# Patient Record
Sex: Male | Born: 1940 | Race: White | Hispanic: No | Marital: Married | Smoking: Never smoker
Health system: Southern US, Community
[De-identification: ages and names within clinical notes are randomized; demographics above are authoritative.]

## PROBLEM LIST (undated history)

## (undated) DIAGNOSIS — R069 Unspecified abnormalities of breathing: Secondary | ICD-10-CM

## (undated) DIAGNOSIS — I10 Essential (primary) hypertension: Secondary | ICD-10-CM

## (undated) DIAGNOSIS — G9340 Encephalopathy, unspecified: Secondary | ICD-10-CM

## (undated) DIAGNOSIS — N289 Disorder of kidney and ureter, unspecified: Secondary | ICD-10-CM

## (undated) HISTORY — PX: PET ALZHEIMER/DEMENTIA STUDY (ARMC HX): HXRAD1433

---

## 2015-01-05 DIAGNOSIS — G4733 Obstructive sleep apnea (adult) (pediatric): Secondary | ICD-10-CM | POA: Diagnosis not present

## 2015-01-07 DIAGNOSIS — H2511 Age-related nuclear cataract, right eye: Secondary | ICD-10-CM | POA: Diagnosis not present

## 2015-01-07 DIAGNOSIS — Z87891 Personal history of nicotine dependence: Secondary | ICD-10-CM | POA: Diagnosis not present

## 2015-01-07 DIAGNOSIS — H259 Unspecified age-related cataract: Secondary | ICD-10-CM | POA: Diagnosis not present

## 2015-01-07 DIAGNOSIS — Z79899 Other long term (current) drug therapy: Secondary | ICD-10-CM | POA: Diagnosis not present

## 2015-01-07 DIAGNOSIS — I10 Essential (primary) hypertension: Secondary | ICD-10-CM | POA: Diagnosis not present

## 2015-01-07 DIAGNOSIS — G4733 Obstructive sleep apnea (adult) (pediatric): Secondary | ICD-10-CM | POA: Diagnosis not present

## 2015-01-07 DIAGNOSIS — Z9981 Dependence on supplemental oxygen: Secondary | ICD-10-CM | POA: Diagnosis not present

## 2015-01-07 DIAGNOSIS — Z7982 Long term (current) use of aspirin: Secondary | ICD-10-CM | POA: Diagnosis not present

## 2015-01-07 DIAGNOSIS — H25811 Combined forms of age-related cataract, right eye: Secondary | ICD-10-CM | POA: Diagnosis not present

## 2015-02-05 DIAGNOSIS — G4733 Obstructive sleep apnea (adult) (pediatric): Secondary | ICD-10-CM | POA: Diagnosis not present

## 2015-02-07 DIAGNOSIS — G4733 Obstructive sleep apnea (adult) (pediatric): Secondary | ICD-10-CM | POA: Diagnosis not present

## 2015-02-18 DIAGNOSIS — Z79899 Other long term (current) drug therapy: Secondary | ICD-10-CM | POA: Diagnosis not present

## 2015-02-18 DIAGNOSIS — Z87891 Personal history of nicotine dependence: Secondary | ICD-10-CM | POA: Diagnosis not present

## 2015-02-18 DIAGNOSIS — H2512 Age-related nuclear cataract, left eye: Secondary | ICD-10-CM | POA: Diagnosis not present

## 2015-02-18 DIAGNOSIS — H25812 Combined forms of age-related cataract, left eye: Secondary | ICD-10-CM | POA: Diagnosis not present

## 2015-02-18 DIAGNOSIS — I10 Essential (primary) hypertension: Secondary | ICD-10-CM | POA: Diagnosis not present

## 2015-02-18 DIAGNOSIS — G4733 Obstructive sleep apnea (adult) (pediatric): Secondary | ICD-10-CM | POA: Diagnosis not present

## 2015-02-18 DIAGNOSIS — H259 Unspecified age-related cataract: Secondary | ICD-10-CM | POA: Diagnosis not present

## 2015-02-18 DIAGNOSIS — Z9981 Dependence on supplemental oxygen: Secondary | ICD-10-CM | POA: Diagnosis not present

## 2015-03-06 DIAGNOSIS — G4733 Obstructive sleep apnea (adult) (pediatric): Secondary | ICD-10-CM | POA: Diagnosis not present

## 2015-04-06 DIAGNOSIS — G4733 Obstructive sleep apnea (adult) (pediatric): Secondary | ICD-10-CM | POA: Diagnosis not present

## 2015-05-06 DIAGNOSIS — G4733 Obstructive sleep apnea (adult) (pediatric): Secondary | ICD-10-CM | POA: Diagnosis not present

## 2015-05-11 DIAGNOSIS — G4733 Obstructive sleep apnea (adult) (pediatric): Secondary | ICD-10-CM | POA: Diagnosis not present

## 2015-05-13 DIAGNOSIS — B356 Tinea cruris: Secondary | ICD-10-CM | POA: Diagnosis not present

## 2015-05-13 DIAGNOSIS — L300 Nummular dermatitis: Secondary | ICD-10-CM | POA: Diagnosis not present

## 2015-05-13 DIAGNOSIS — L570 Actinic keratosis: Secondary | ICD-10-CM | POA: Diagnosis not present

## 2015-06-06 DIAGNOSIS — G4733 Obstructive sleep apnea (adult) (pediatric): Secondary | ICD-10-CM | POA: Diagnosis not present

## 2015-07-06 DIAGNOSIS — G4733 Obstructive sleep apnea (adult) (pediatric): Secondary | ICD-10-CM | POA: Diagnosis not present

## 2015-07-30 DIAGNOSIS — I10 Essential (primary) hypertension: Secondary | ICD-10-CM | POA: Diagnosis not present

## 2015-07-30 DIAGNOSIS — Z0000 Encounter for general adult medical examination without abnormal findings: Secondary | ICD-10-CM | POA: Diagnosis not present

## 2015-07-30 DIAGNOSIS — E782 Mixed hyperlipidemia: Secondary | ICD-10-CM | POA: Diagnosis not present

## 2015-07-30 DIAGNOSIS — Z9181 History of falling: Secondary | ICD-10-CM | POA: Diagnosis not present

## 2015-08-06 DIAGNOSIS — G4733 Obstructive sleep apnea (adult) (pediatric): Secondary | ICD-10-CM | POA: Diagnosis not present

## 2015-09-06 DIAGNOSIS — G4733 Obstructive sleep apnea (adult) (pediatric): Secondary | ICD-10-CM | POA: Diagnosis not present

## 2015-09-20 DIAGNOSIS — I10 Essential (primary) hypertension: Secondary | ICD-10-CM | POA: Diagnosis not present

## 2015-09-20 DIAGNOSIS — Z7982 Long term (current) use of aspirin: Secondary | ICD-10-CM | POA: Diagnosis not present

## 2015-09-20 DIAGNOSIS — N132 Hydronephrosis with renal and ureteral calculous obstruction: Secondary | ICD-10-CM | POA: Diagnosis not present

## 2015-09-20 DIAGNOSIS — Z79899 Other long term (current) drug therapy: Secondary | ICD-10-CM | POA: Diagnosis not present

## 2015-09-20 DIAGNOSIS — N201 Calculus of ureter: Secondary | ICD-10-CM | POA: Diagnosis not present

## 2015-09-21 DIAGNOSIS — N132 Hydronephrosis with renal and ureteral calculous obstruction: Secondary | ICD-10-CM | POA: Diagnosis not present

## 2015-09-22 DIAGNOSIS — N200 Calculus of kidney: Secondary | ICD-10-CM | POA: Diagnosis not present

## 2015-09-25 DIAGNOSIS — H524 Presbyopia: Secondary | ICD-10-CM | POA: Diagnosis not present

## 2015-09-25 DIAGNOSIS — H26493 Other secondary cataract, bilateral: Secondary | ICD-10-CM | POA: Diagnosis not present

## 2015-10-03 DIAGNOSIS — G4733 Obstructive sleep apnea (adult) (pediatric): Secondary | ICD-10-CM | POA: Diagnosis not present

## 2015-10-06 DIAGNOSIS — G4733 Obstructive sleep apnea (adult) (pediatric): Secondary | ICD-10-CM | POA: Diagnosis not present

## 2015-11-06 DIAGNOSIS — G4733 Obstructive sleep apnea (adult) (pediatric): Secondary | ICD-10-CM | POA: Diagnosis not present

## 2015-11-10 DIAGNOSIS — I10 Essential (primary) hypertension: Secondary | ICD-10-CM | POA: Diagnosis not present

## 2015-11-10 DIAGNOSIS — K219 Gastro-esophageal reflux disease without esophagitis: Secondary | ICD-10-CM | POA: Diagnosis not present

## 2015-11-10 DIAGNOSIS — H1030 Unspecified acute conjunctivitis, unspecified eye: Secondary | ICD-10-CM | POA: Diagnosis not present

## 2015-11-10 DIAGNOSIS — J209 Acute bronchitis, unspecified: Secondary | ICD-10-CM | POA: Diagnosis not present

## 2015-11-13 DIAGNOSIS — G4733 Obstructive sleep apnea (adult) (pediatric): Secondary | ICD-10-CM | POA: Diagnosis not present

## 2015-11-18 DIAGNOSIS — E782 Mixed hyperlipidemia: Secondary | ICD-10-CM | POA: Diagnosis not present

## 2015-11-18 DIAGNOSIS — Z79899 Other long term (current) drug therapy: Secondary | ICD-10-CM | POA: Diagnosis not present

## 2015-11-25 DIAGNOSIS — Z0000 Encounter for general adult medical examination without abnormal findings: Secondary | ICD-10-CM | POA: Diagnosis not present

## 2015-11-27 DIAGNOSIS — R05 Cough: Secondary | ICD-10-CM | POA: Diagnosis not present

## 2015-12-06 DIAGNOSIS — G4733 Obstructive sleep apnea (adult) (pediatric): Secondary | ICD-10-CM | POA: Diagnosis not present

## 2016-01-06 DIAGNOSIS — G4733 Obstructive sleep apnea (adult) (pediatric): Secondary | ICD-10-CM | POA: Diagnosis not present

## 2016-02-06 DIAGNOSIS — G4733 Obstructive sleep apnea (adult) (pediatric): Secondary | ICD-10-CM | POA: Diagnosis not present

## 2016-02-12 DIAGNOSIS — G4733 Obstructive sleep apnea (adult) (pediatric): Secondary | ICD-10-CM | POA: Diagnosis not present

## 2016-03-05 DIAGNOSIS — G4733 Obstructive sleep apnea (adult) (pediatric): Secondary | ICD-10-CM | POA: Diagnosis not present

## 2016-03-18 DIAGNOSIS — Z1389 Encounter for screening for other disorder: Secondary | ICD-10-CM | POA: Diagnosis not present

## 2016-03-18 DIAGNOSIS — Z79899 Other long term (current) drug therapy: Secondary | ICD-10-CM | POA: Diagnosis not present

## 2016-03-18 DIAGNOSIS — E538 Deficiency of other specified B group vitamins: Secondary | ICD-10-CM | POA: Diagnosis not present

## 2016-03-18 DIAGNOSIS — I10 Essential (primary) hypertension: Secondary | ICD-10-CM | POA: Diagnosis not present

## 2016-03-18 DIAGNOSIS — Z2821 Immunization not carried out because of patient refusal: Secondary | ICD-10-CM | POA: Diagnosis not present

## 2016-03-18 DIAGNOSIS — R5383 Other fatigue: Secondary | ICD-10-CM | POA: Diagnosis not present

## 2016-03-22 DIAGNOSIS — E538 Deficiency of other specified B group vitamins: Secondary | ICD-10-CM | POA: Diagnosis not present

## 2016-03-29 DIAGNOSIS — D510 Vitamin B12 deficiency anemia due to intrinsic factor deficiency: Secondary | ICD-10-CM | POA: Diagnosis not present

## 2016-04-05 DIAGNOSIS — G4733 Obstructive sleep apnea (adult) (pediatric): Secondary | ICD-10-CM | POA: Diagnosis not present

## 2016-04-06 DIAGNOSIS — E538 Deficiency of other specified B group vitamins: Secondary | ICD-10-CM | POA: Diagnosis not present

## 2016-04-12 DIAGNOSIS — E538 Deficiency of other specified B group vitamins: Secondary | ICD-10-CM | POA: Diagnosis not present

## 2016-05-05 DIAGNOSIS — G4733 Obstructive sleep apnea (adult) (pediatric): Secondary | ICD-10-CM | POA: Diagnosis not present

## 2016-06-05 DIAGNOSIS — G4733 Obstructive sleep apnea (adult) (pediatric): Secondary | ICD-10-CM | POA: Diagnosis not present

## 2016-06-23 DIAGNOSIS — I4892 Unspecified atrial flutter: Secondary | ICD-10-CM | POA: Diagnosis present

## 2016-06-23 DIAGNOSIS — E785 Hyperlipidemia, unspecified: Secondary | ICD-10-CM | POA: Insufficient documentation

## 2016-06-23 DIAGNOSIS — I10 Essential (primary) hypertension: Secondary | ICD-10-CM | POA: Diagnosis present

## 2016-06-24 DIAGNOSIS — I4892 Unspecified atrial flutter: Secondary | ICD-10-CM | POA: Diagnosis not present

## 2016-06-24 DIAGNOSIS — E785 Hyperlipidemia, unspecified: Secondary | ICD-10-CM | POA: Diagnosis not present

## 2016-07-01 DIAGNOSIS — G4733 Obstructive sleep apnea (adult) (pediatric): Secondary | ICD-10-CM | POA: Diagnosis not present

## 2016-07-06 DIAGNOSIS — I4892 Unspecified atrial flutter: Secondary | ICD-10-CM | POA: Diagnosis not present

## 2016-08-04 DIAGNOSIS — D485 Neoplasm of uncertain behavior of skin: Secondary | ICD-10-CM | POA: Diagnosis not present

## 2016-08-04 DIAGNOSIS — L570 Actinic keratosis: Secondary | ICD-10-CM | POA: Diagnosis not present

## 2016-10-01 DIAGNOSIS — I10 Essential (primary) hypertension: Secondary | ICD-10-CM | POA: Diagnosis not present

## 2016-10-01 DIAGNOSIS — I82401 Acute embolism and thrombosis of unspecified deep veins of right lower extremity: Secondary | ICD-10-CM | POA: Diagnosis not present

## 2016-10-01 DIAGNOSIS — I82491 Acute embolism and thrombosis of other specified deep vein of right lower extremity: Secondary | ICD-10-CM | POA: Diagnosis not present

## 2016-10-01 DIAGNOSIS — Z79899 Other long term (current) drug therapy: Secondary | ICD-10-CM | POA: Diagnosis not present

## 2016-10-01 DIAGNOSIS — R600 Localized edema: Secondary | ICD-10-CM | POA: Diagnosis not present

## 2016-10-01 DIAGNOSIS — M79661 Pain in right lower leg: Secondary | ICD-10-CM | POA: Diagnosis not present

## 2016-10-04 DIAGNOSIS — I10 Essential (primary) hypertension: Secondary | ICD-10-CM | POA: Diagnosis not present

## 2016-10-04 DIAGNOSIS — I4892 Unspecified atrial flutter: Secondary | ICD-10-CM | POA: Diagnosis not present

## 2016-10-04 DIAGNOSIS — Z2821 Immunization not carried out because of patient refusal: Secondary | ICD-10-CM | POA: Diagnosis not present

## 2016-10-04 DIAGNOSIS — Z0000 Encounter for general adult medical examination without abnormal findings: Secondary | ICD-10-CM | POA: Diagnosis not present

## 2016-10-04 DIAGNOSIS — I82409 Acute embolism and thrombosis of unspecified deep veins of unspecified lower extremity: Secondary | ICD-10-CM | POA: Diagnosis not present

## 2016-11-19 DIAGNOSIS — R5381 Other malaise: Secondary | ICD-10-CM | POA: Diagnosis not present

## 2016-11-19 DIAGNOSIS — R5383 Other fatigue: Secondary | ICD-10-CM | POA: Diagnosis not present

## 2016-11-25 DIAGNOSIS — I10 Essential (primary) hypertension: Secondary | ICD-10-CM | POA: Diagnosis not present

## 2016-11-25 DIAGNOSIS — Z9181 History of falling: Secondary | ICD-10-CM | POA: Diagnosis not present

## 2016-11-25 DIAGNOSIS — I82409 Acute embolism and thrombosis of unspecified deep veins of unspecified lower extremity: Secondary | ICD-10-CM | POA: Diagnosis not present

## 2016-11-25 DIAGNOSIS — I4892 Unspecified atrial flutter: Secondary | ICD-10-CM | POA: Diagnosis not present

## 2016-12-10 DIAGNOSIS — E782 Mixed hyperlipidemia: Secondary | ICD-10-CM | POA: Diagnosis not present

## 2016-12-10 DIAGNOSIS — R432 Parageusia: Secondary | ICD-10-CM | POA: Diagnosis not present

## 2016-12-10 DIAGNOSIS — R7302 Impaired glucose tolerance (oral): Secondary | ICD-10-CM | POA: Diagnosis not present

## 2016-12-10 DIAGNOSIS — D510 Vitamin B12 deficiency anemia due to intrinsic factor deficiency: Secondary | ICD-10-CM | POA: Diagnosis not present

## 2016-12-14 DIAGNOSIS — D510 Vitamin B12 deficiency anemia due to intrinsic factor deficiency: Secondary | ICD-10-CM | POA: Diagnosis not present

## 2016-12-22 DIAGNOSIS — D510 Vitamin B12 deficiency anemia due to intrinsic factor deficiency: Secondary | ICD-10-CM | POA: Diagnosis not present

## 2016-12-31 DIAGNOSIS — D510 Vitamin B12 deficiency anemia due to intrinsic factor deficiency: Secondary | ICD-10-CM | POA: Diagnosis not present

## 2017-01-03 DIAGNOSIS — G4733 Obstructive sleep apnea (adult) (pediatric): Secondary | ICD-10-CM | POA: Diagnosis not present

## 2017-01-19 DIAGNOSIS — D510 Vitamin B12 deficiency anemia due to intrinsic factor deficiency: Secondary | ICD-10-CM | POA: Diagnosis not present

## 2017-01-25 DIAGNOSIS — I77819 Aortic ectasia, unspecified site: Secondary | ICD-10-CM | POA: Diagnosis not present

## 2017-01-25 DIAGNOSIS — K769 Liver disease, unspecified: Secondary | ICD-10-CM | POA: Diagnosis not present

## 2017-01-25 DIAGNOSIS — R918 Other nonspecific abnormal finding of lung field: Secondary | ICD-10-CM | POA: Diagnosis not present

## 2017-01-25 DIAGNOSIS — K869 Disease of pancreas, unspecified: Secondary | ICD-10-CM | POA: Diagnosis not present

## 2017-02-01 DIAGNOSIS — C259 Malignant neoplasm of pancreas, unspecified: Secondary | ICD-10-CM | POA: Diagnosis not present

## 2017-02-01 DIAGNOSIS — K862 Cyst of pancreas: Secondary | ICD-10-CM | POA: Diagnosis not present

## 2017-02-02 DIAGNOSIS — Z125 Encounter for screening for malignant neoplasm of prostate: Secondary | ICD-10-CM | POA: Diagnosis not present

## 2017-02-02 DIAGNOSIS — R946 Abnormal results of thyroid function studies: Secondary | ICD-10-CM | POA: Diagnosis not present

## 2017-02-02 DIAGNOSIS — R600 Localized edema: Secondary | ICD-10-CM | POA: Diagnosis not present

## 2017-02-02 DIAGNOSIS — K921 Melena: Secondary | ICD-10-CM | POA: Diagnosis not present

## 2017-02-04 DIAGNOSIS — R978 Other abnormal tumor markers: Secondary | ICD-10-CM | POA: Diagnosis not present

## 2017-02-04 DIAGNOSIS — F0390 Unspecified dementia without behavioral disturbance: Secondary | ICD-10-CM | POA: Diagnosis not present

## 2017-02-04 DIAGNOSIS — K769 Liver disease, unspecified: Secondary | ICD-10-CM | POA: Diagnosis not present

## 2017-02-04 DIAGNOSIS — K869 Disease of pancreas, unspecified: Secondary | ICD-10-CM | POA: Diagnosis not present

## 2017-02-07 DIAGNOSIS — R946 Abnormal results of thyroid function studies: Secondary | ICD-10-CM | POA: Diagnosis not present

## 2017-02-08 DIAGNOSIS — K5900 Constipation, unspecified: Secondary | ICD-10-CM | POA: Diagnosis not present

## 2017-02-08 DIAGNOSIS — K862 Cyst of pancreas: Secondary | ICD-10-CM | POA: Diagnosis not present

## 2017-02-08 DIAGNOSIS — K921 Melena: Secondary | ICD-10-CM | POA: Diagnosis not present

## 2017-02-14 DIAGNOSIS — R413 Other amnesia: Secondary | ICD-10-CM | POA: Diagnosis not present

## 2017-02-15 DIAGNOSIS — R413 Other amnesia: Secondary | ICD-10-CM | POA: Diagnosis not present

## 2017-02-15 DIAGNOSIS — I10 Essential (primary) hypertension: Secondary | ICD-10-CM | POA: Diagnosis not present

## 2017-02-15 DIAGNOSIS — Z9181 History of falling: Secondary | ICD-10-CM | POA: Diagnosis not present

## 2017-02-16 DIAGNOSIS — H5500 Unspecified nystagmus: Secondary | ICD-10-CM | POA: Diagnosis not present

## 2017-02-17 DIAGNOSIS — H53461 Homonymous bilateral field defects, right side: Secondary | ICD-10-CM | POA: Diagnosis not present

## 2017-02-18 DIAGNOSIS — D510 Vitamin B12 deficiency anemia due to intrinsic factor deficiency: Secondary | ICD-10-CM | POA: Diagnosis not present

## 2017-03-17 DIAGNOSIS — H4312 Vitreous hemorrhage, left eye: Secondary | ICD-10-CM | POA: Diagnosis not present

## 2017-03-23 DIAGNOSIS — H4312 Vitreous hemorrhage, left eye: Secondary | ICD-10-CM | POA: Diagnosis not present

## 2017-03-24 DIAGNOSIS — H4312 Vitreous hemorrhage, left eye: Secondary | ICD-10-CM | POA: Diagnosis not present

## 2017-03-30 DIAGNOSIS — D510 Vitamin B12 deficiency anemia due to intrinsic factor deficiency: Secondary | ICD-10-CM | POA: Diagnosis not present

## 2017-04-07 DIAGNOSIS — H43812 Vitreous degeneration, left eye: Secondary | ICD-10-CM | POA: Diagnosis not present

## 2017-04-21 DIAGNOSIS — H43812 Vitreous degeneration, left eye: Secondary | ICD-10-CM | POA: Diagnosis not present

## 2017-04-26 DIAGNOSIS — F0150 Vascular dementia without behavioral disturbance: Secondary | ICD-10-CM | POA: Diagnosis present

## 2017-04-26 DIAGNOSIS — Z9181 History of falling: Secondary | ICD-10-CM | POA: Diagnosis not present

## 2017-04-26 DIAGNOSIS — R413 Other amnesia: Secondary | ICD-10-CM | POA: Diagnosis not present

## 2017-04-28 DIAGNOSIS — H43812 Vitreous degeneration, left eye: Secondary | ICD-10-CM | POA: Diagnosis not present

## 2017-04-28 DIAGNOSIS — H353132 Nonexudative age-related macular degeneration, bilateral, intermediate dry stage: Secondary | ICD-10-CM | POA: Diagnosis not present

## 2017-05-03 DIAGNOSIS — R935 Abnormal findings on diagnostic imaging of other abdominal regions, including retroperitoneum: Secondary | ICD-10-CM | POA: Diagnosis not present

## 2017-05-03 DIAGNOSIS — I700 Atherosclerosis of aorta: Secondary | ICD-10-CM | POA: Diagnosis not present

## 2017-05-03 DIAGNOSIS — K869 Disease of pancreas, unspecified: Secondary | ICD-10-CM | POA: Diagnosis not present

## 2017-05-03 DIAGNOSIS — K8689 Other specified diseases of pancreas: Secondary | ICD-10-CM | POA: Diagnosis not present

## 2017-05-03 DIAGNOSIS — N400 Enlarged prostate without lower urinary tract symptoms: Secondary | ICD-10-CM | POA: Diagnosis not present

## 2017-05-04 DIAGNOSIS — L03115 Cellulitis of right lower limb: Secondary | ICD-10-CM | POA: Diagnosis not present

## 2017-05-04 DIAGNOSIS — K769 Liver disease, unspecified: Secondary | ICD-10-CM | POA: Diagnosis not present

## 2017-05-04 DIAGNOSIS — K869 Disease of pancreas, unspecified: Secondary | ICD-10-CM | POA: Diagnosis not present

## 2017-05-05 DIAGNOSIS — H353132 Nonexudative age-related macular degeneration, bilateral, intermediate dry stage: Secondary | ICD-10-CM | POA: Diagnosis not present

## 2017-05-05 DIAGNOSIS — H43813 Vitreous degeneration, bilateral: Secondary | ICD-10-CM | POA: Diagnosis not present

## 2017-05-30 DIAGNOSIS — R600 Localized edema: Secondary | ICD-10-CM | POA: Diagnosis not present

## 2017-05-30 DIAGNOSIS — D510 Vitamin B12 deficiency anemia due to intrinsic factor deficiency: Secondary | ICD-10-CM | POA: Diagnosis not present

## 2017-05-30 DIAGNOSIS — R5383 Other fatigue: Secondary | ICD-10-CM | POA: Diagnosis not present

## 2017-05-30 DIAGNOSIS — Z1389 Encounter for screening for other disorder: Secondary | ICD-10-CM | POA: Diagnosis not present

## 2017-05-30 DIAGNOSIS — L03119 Cellulitis of unspecified part of limb: Secondary | ICD-10-CM | POA: Diagnosis not present

## 2017-06-02 DIAGNOSIS — H353132 Nonexudative age-related macular degeneration, bilateral, intermediate dry stage: Secondary | ICD-10-CM | POA: Diagnosis not present

## 2017-06-07 ENCOUNTER — Encounter: Payer: Self-pay | Admitting: Neurology

## 2017-06-30 DIAGNOSIS — H353132 Nonexudative age-related macular degeneration, bilateral, intermediate dry stage: Secondary | ICD-10-CM | POA: Diagnosis not present

## 2017-06-30 DIAGNOSIS — H43813 Vitreous degeneration, bilateral: Secondary | ICD-10-CM | POA: Diagnosis not present

## 2017-07-22 DIAGNOSIS — M7021 Olecranon bursitis, right elbow: Secondary | ICD-10-CM | POA: Diagnosis not present

## 2017-07-26 DIAGNOSIS — D510 Vitamin B12 deficiency anemia due to intrinsic factor deficiency: Secondary | ICD-10-CM | POA: Diagnosis not present

## 2017-07-28 DIAGNOSIS — H43813 Vitreous degeneration, bilateral: Secondary | ICD-10-CM | POA: Diagnosis not present

## 2017-07-28 DIAGNOSIS — H353132 Nonexudative age-related macular degeneration, bilateral, intermediate dry stage: Secondary | ICD-10-CM | POA: Diagnosis not present

## 2017-08-08 DIAGNOSIS — L578 Other skin changes due to chronic exposure to nonionizing radiation: Secondary | ICD-10-CM | POA: Diagnosis not present

## 2017-08-08 DIAGNOSIS — D225 Melanocytic nevi of trunk: Secondary | ICD-10-CM | POA: Diagnosis not present

## 2017-08-08 DIAGNOSIS — L570 Actinic keratosis: Secondary | ICD-10-CM | POA: Diagnosis not present

## 2017-08-08 DIAGNOSIS — R233 Spontaneous ecchymoses: Secondary | ICD-10-CM | POA: Diagnosis not present

## 2017-09-12 ENCOUNTER — Encounter: Payer: Self-pay | Admitting: Neurology

## 2017-09-12 ENCOUNTER — Ambulatory Visit (INDEPENDENT_AMBULATORY_CARE_PROVIDER_SITE_OTHER): Payer: Medicare Other | Admitting: Neurology

## 2017-09-12 VITALS — BP 174/82 | HR 64 | Ht 70.0 in | Wt 235.4 lb

## 2017-09-12 DIAGNOSIS — I10 Essential (primary) hypertension: Secondary | ICD-10-CM

## 2017-09-12 DIAGNOSIS — G301 Alzheimer's disease with late onset: Secondary | ICD-10-CM

## 2017-09-12 DIAGNOSIS — F0280 Dementia in other diseases classified elsewhere without behavioral disturbance: Secondary | ICD-10-CM

## 2017-09-12 DIAGNOSIS — H5500 Unspecified nystagmus: Secondary | ICD-10-CM | POA: Diagnosis not present

## 2017-09-12 NOTE — Patient Instructions (Signed)
1.  Let me know if you wish to restart the dementia medications and I will prescribe it for you. 2.  Please bring the MRI images on CD for me to review 3.  Follow up in 6 months.   RESOURCES: Development worker, community of [facility]: [phone]  Tel [place]:  [phone]  [email or web address]/resources.[email or web address]   Resources for common questions found under "Pathways & Protocols "  www.senior-resources-[place].org/pathways/Pathways_Menu.htm   Resources for [facility] facilities:  www.ncnursinghomeguide.com   Alzheimer's Association: Website:  http://rojas.com/ Phone:  [phone]  For assistance with senior care, elder law, and estate planning (POA, medical directives):  [employer]  [address], [place] [address]  Tel: [phone]  [email or web address]   Berneice Heinrich  Tel: [phone]  [email or web address]

## 2017-09-12 NOTE — Progress Notes (Signed)
NEUROLOGY CONSULTATION NOTE  [name] MRN: [id number] DOB: [birth year]  Referring provider: Dr. Lin Bradley Primary care provider: Dr. Lin Bradley  Reason for consult:  Memory deficits, nystagmus  HISTORY OF PRESENT ILLNESS: Alexander Bradley is a 76 year old right-handed male with hypertension who presents for memory deficits and nystagmus.  He is accompanied by his son who supplements history.  Since summer 2017, he has had noticeable short term memory deficits.  Specifically, he would easily forget conversations after a day.  Subsequently, he started having trouble remembering to take his medications.  He also has had trouble keeping up with his bills and now his son has to assist with his finances.  His wife has Alzheimer's dementia, which has significantly progressed over the past year.  She now lives in a SNF and he currently is living by himself.  He is still driving, although only locally around his hometown of [place], and only to his son's house in [place].  He has not gotten disoriented on familiar routes.  He prepares his own meals and manages simple ADLs.  Other than medication and finances, he is independent with complex ADLs.  While he previously was stressed and had a little depressed mood caring for his wife, he is doing much better.  He has not had change in personality.  He has not exhibited combativeness or confusion.  He has not had hallucinations or delusions.    Also, he was evaluated by the eye doctor a few months ago and was noted to have horizontal nystagmus.  He denies double vision, slurred speech, trouble swallowing, unsteady gait/falls or unilateral numbness or weakness.  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  His mother had Alzheimer's dementia.  MRI of brain from 02/11/17 reportedly showed atrophy and chronic small vessel ischemic changes. 02/18/17:  Sed Rate 19, RPR negative. He has history of B12 deficiency and has  undergone injections. MMSE from 04/26/17 was 27/30.  Medications include:  amlodipine, lisinopril, Benicar, Xarelto (for DVT), ASA 81mg .  He was previously on Aricept and Namenda but was taken off of them.  PAST MEDICAL HISTORY: No past medical history on file.  PAST SURGICAL HISTORY: No past surgical history on file.  MEDICATIONS: No current outpatient prescriptions on file prior to visit.   No current facility-administered medications on file prior to visit.     ALLERGIES: Not on File  FAMILY HISTORY: No family history on file. .  SOCIAL HISTORY: Social History   Social History  . Marital status: Married    Spouse name: N/A  . Number of children: N/A  . Years of education: BS   Occupational History  . retired    Social History Main Topics  . Smoking status: Not on file  . Smokeless tobacco: Not on file  . Alcohol use Not on file  . Drug use: Unknown  . Sexual activity: Not on file   Other Topics Concern  . Not on file   Social History Narrative   Married for 61 yrs, wife is now in a SNF, he lives alone, mentioned he does not like it at all.   Has a one story home, no pets.   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, was in [employer]    REVIEW OF SYSTEMS: Constitutional: No fevers, chills, or sweats, no generalized fatigue, change in appetite Eyes: No visual changes, double vision, eye pain Ear, nose and throat: No  hearing loss, ear pain, nasal congestion, sore throat Cardiovascular: No chest pain, palpitations Respiratory:  No shortness of breath at rest or with exertion, wheezes GastrointestinaI: No nausea, vomiting, diarrhea, abdominal pain, fecal incontinence Genitourinary:  No dysuria, urinary retention or frequency Musculoskeletal:  No neck pain, back pain Integumentary: No rash, pruritus, skin lesions Neurological: as above Psychiatric: No depression, insomnia, anxiety Endocrine: No palpitations, fatigue,  diaphoresis, mood swings, change in appetite, change in weight, increased thirst Hematologic/Lymphatic:  No purpura, petechiae. Allergic/Immunologic: no itchy/runny eyes, nasal congestion, recent allergic reactions, rashes  PHYSICAL EXAM: BP: 174/82, pulse 64, SpO2 95%, Wt 235 lb 6.4 oz, Ht 5'10" General: No acute distress.  Patient appears well-groomed.  Head:  Normocephalic/atraumatic Eyes:  fundi examined but not visualized Neck: supple, no paraspinal tenderness, full range of motion Back: No paraspinal tenderness Heart: regular rate and rhythm Lungs: Clear to auscultation bilaterally. Vascular: No carotid bruits. Neurological Exam: Mental status: alert and oriented to person, place, and time, recent and remote memory intact, fund of knowledge intact, attention and concentration intact, speech fluent and not dysarthric, language intact. Montreal Cognitive Assessment  09/12/2017  Visuospatial/ Executive (0/5) 1  Naming (0/3) 3  Attention: Read list of digits (0/2) 1  Attention: Read list of letters (0/1) 0  Attention: Serial 7 subtraction starting at 100 (0/3) 3  Language: Repeat phrase (0/2) 2  Language : Fluency (0/1) 0  Abstraction (0/2) 2  Delayed Recall (0/5) 1  Orientation (0/6) 5  Total 18  Adjusted Score (based on education) 18   Cranial nerves: CN I: not tested CN II: pupils equal, round and reactive to light, visual fields intact CN III, IV, VI:  full range of motion, slight nonsustained horizontal nystagmus bilaterally, no ptosis CN V: facial sensation intact CN VII: upper and lower face symmetric CN VIII: hearing intact CN IX, X: gag intact, uvula midline CN XI: sternocleidomastoid and trapezius muscles intact CN XII: tongue midline Bulk & Tone: normal, no fasciculations. Motor:  5/5 throughout.   Sensation: temperature reduced and vibration sensation intact. Deep Tendon Reflexes:  2+ throughout but slightly brisk in the upper extremities, toes downgoing.    Finger to nose testing:  Without dysmetria. Heel to shin:  Without dysmetria.  Gait:  Normal station and stride with reduced arm swing, less on the left).  Able to turn, difficulty with tandem walk. Romberg negative.  IMPRESSION: 1.  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  Nystagmus.  It is not very  Impressive.  I don't suspect a serious underlying condition as other lateralizing symptoms from brainstem/cerebellum are absent and MRI reportedly unremarkable. 3.  Hypertension  PLAN: 1.  Advised to restart Aricept and Namenda.  They would like to first discuss with Dr. Lin Bradley. 2.  Limit driving to only around [place] and during daylight hours.  Periodically, his son should ride in the passenger seat to observe his driving. 3.  Use pillbox to help with taking medication.  Son should help him set it up in the beginning of the week. 4.  Consider neuropsychological testing 5.  Asked his son to bring me a copy of the MRI on CD for personal review. 6.  Advised to establish POA 7.  Regarding blood pressure, advised to contact Dr. Janace Aris office immediately. 8.  Follow up in 6 months.  Thank you for allowing me to take part in the care of this patient.  Alexander Clines, DO  CC:  Lovette Cliche II, MD

## 2017-09-13 ENCOUNTER — Encounter: Payer: Self-pay | Admitting: Neurology

## 2017-09-13 DIAGNOSIS — G309 Alzheimer's disease, unspecified: Secondary | ICD-10-CM | POA: Diagnosis not present

## 2017-09-13 DIAGNOSIS — Z23 Encounter for immunization: Secondary | ICD-10-CM | POA: Diagnosis not present

## 2017-09-13 DIAGNOSIS — I10 Essential (primary) hypertension: Secondary | ICD-10-CM | POA: Diagnosis not present

## 2017-09-26 DIAGNOSIS — D510 Vitamin B12 deficiency anemia due to intrinsic factor deficiency: Secondary | ICD-10-CM | POA: Diagnosis not present

## 2017-10-27 DIAGNOSIS — H26493 Other secondary cataract, bilateral: Secondary | ICD-10-CM | POA: Diagnosis not present

## 2017-10-27 DIAGNOSIS — H353132 Nonexudative age-related macular degeneration, bilateral, intermediate dry stage: Secondary | ICD-10-CM | POA: Diagnosis not present

## 2017-10-28 DIAGNOSIS — K869 Disease of pancreas, unspecified: Secondary | ICD-10-CM | POA: Diagnosis not present

## 2017-10-28 DIAGNOSIS — M9903 Segmental and somatic dysfunction of lumbar region: Secondary | ICD-10-CM | POA: Diagnosis not present

## 2017-10-28 DIAGNOSIS — S29019A Strain of muscle and tendon of unspecified wall of thorax, initial encounter: Secondary | ICD-10-CM | POA: Diagnosis not present

## 2017-10-28 DIAGNOSIS — M5387 Other specified dorsopathies, lumbosacral region: Secondary | ICD-10-CM | POA: Diagnosis not present

## 2017-10-28 DIAGNOSIS — R978 Other abnormal tumor markers: Secondary | ICD-10-CM | POA: Diagnosis not present

## 2017-10-28 DIAGNOSIS — S39012A Strain of muscle, fascia and tendon of lower back, initial encounter: Secondary | ICD-10-CM | POA: Diagnosis not present

## 2017-10-28 DIAGNOSIS — K8689 Other specified diseases of pancreas: Secondary | ICD-10-CM | POA: Diagnosis not present

## 2017-10-28 DIAGNOSIS — M9902 Segmental and somatic dysfunction of thoracic region: Secondary | ICD-10-CM | POA: Diagnosis not present

## 2017-11-02 DIAGNOSIS — M9902 Segmental and somatic dysfunction of thoracic region: Secondary | ICD-10-CM | POA: Diagnosis not present

## 2017-11-02 DIAGNOSIS — M5387 Other specified dorsopathies, lumbosacral region: Secondary | ICD-10-CM | POA: Diagnosis not present

## 2017-11-02 DIAGNOSIS — S39012A Strain of muscle, fascia and tendon of lower back, initial encounter: Secondary | ICD-10-CM | POA: Diagnosis not present

## 2017-11-02 DIAGNOSIS — M9903 Segmental and somatic dysfunction of lumbar region: Secondary | ICD-10-CM | POA: Diagnosis not present

## 2017-11-02 DIAGNOSIS — S29019A Strain of muscle and tendon of unspecified wall of thorax, initial encounter: Secondary | ICD-10-CM | POA: Diagnosis not present

## 2017-11-04 DIAGNOSIS — S29019A Strain of muscle and tendon of unspecified wall of thorax, initial encounter: Secondary | ICD-10-CM | POA: Diagnosis not present

## 2017-11-04 DIAGNOSIS — M9903 Segmental and somatic dysfunction of lumbar region: Secondary | ICD-10-CM | POA: Diagnosis not present

## 2017-11-04 DIAGNOSIS — M9902 Segmental and somatic dysfunction of thoracic region: Secondary | ICD-10-CM | POA: Diagnosis not present

## 2017-11-04 DIAGNOSIS — S39012A Strain of muscle, fascia and tendon of lower back, initial encounter: Secondary | ICD-10-CM | POA: Diagnosis not present

## 2017-11-04 DIAGNOSIS — M5387 Other specified dorsopathies, lumbosacral region: Secondary | ICD-10-CM | POA: Diagnosis not present

## 2017-11-09 DIAGNOSIS — M9903 Segmental and somatic dysfunction of lumbar region: Secondary | ICD-10-CM | POA: Diagnosis not present

## 2017-11-09 DIAGNOSIS — M9902 Segmental and somatic dysfunction of thoracic region: Secondary | ICD-10-CM | POA: Diagnosis not present

## 2017-11-09 DIAGNOSIS — S39012A Strain of muscle, fascia and tendon of lower back, initial encounter: Secondary | ICD-10-CM | POA: Diagnosis not present

## 2017-11-09 DIAGNOSIS — M5387 Other specified dorsopathies, lumbosacral region: Secondary | ICD-10-CM | POA: Diagnosis not present

## 2017-11-09 DIAGNOSIS — S29019A Strain of muscle and tendon of unspecified wall of thorax, initial encounter: Secondary | ICD-10-CM | POA: Diagnosis not present

## 2017-11-11 DIAGNOSIS — M9902 Segmental and somatic dysfunction of thoracic region: Secondary | ICD-10-CM | POA: Diagnosis not present

## 2017-11-11 DIAGNOSIS — S39012A Strain of muscle, fascia and tendon of lower back, initial encounter: Secondary | ICD-10-CM | POA: Diagnosis not present

## 2017-11-11 DIAGNOSIS — M5387 Other specified dorsopathies, lumbosacral region: Secondary | ICD-10-CM | POA: Diagnosis not present

## 2017-11-11 DIAGNOSIS — S29019A Strain of muscle and tendon of unspecified wall of thorax, initial encounter: Secondary | ICD-10-CM | POA: Diagnosis not present

## 2017-11-11 DIAGNOSIS — M9903 Segmental and somatic dysfunction of lumbar region: Secondary | ICD-10-CM | POA: Diagnosis not present

## 2017-11-14 DIAGNOSIS — S29019A Strain of muscle and tendon of unspecified wall of thorax, initial encounter: Secondary | ICD-10-CM | POA: Diagnosis not present

## 2017-11-14 DIAGNOSIS — M9903 Segmental and somatic dysfunction of lumbar region: Secondary | ICD-10-CM | POA: Diagnosis not present

## 2017-11-14 DIAGNOSIS — M9902 Segmental and somatic dysfunction of thoracic region: Secondary | ICD-10-CM | POA: Diagnosis not present

## 2017-11-14 DIAGNOSIS — M5387 Other specified dorsopathies, lumbosacral region: Secondary | ICD-10-CM | POA: Diagnosis not present

## 2017-11-14 DIAGNOSIS — S39012A Strain of muscle, fascia and tendon of lower back, initial encounter: Secondary | ICD-10-CM | POA: Diagnosis not present

## 2017-11-15 DIAGNOSIS — M545 Low back pain: Secondary | ICD-10-CM | POA: Diagnosis not present

## 2017-11-15 DIAGNOSIS — I10 Essential (primary) hypertension: Secondary | ICD-10-CM | POA: Diagnosis not present

## 2017-11-16 DIAGNOSIS — M9903 Segmental and somatic dysfunction of lumbar region: Secondary | ICD-10-CM | POA: Diagnosis not present

## 2017-11-16 DIAGNOSIS — S39012A Strain of muscle, fascia and tendon of lower back, initial encounter: Secondary | ICD-10-CM | POA: Diagnosis not present

## 2017-11-16 DIAGNOSIS — S29019A Strain of muscle and tendon of unspecified wall of thorax, initial encounter: Secondary | ICD-10-CM | POA: Diagnosis not present

## 2017-11-16 DIAGNOSIS — M9902 Segmental and somatic dysfunction of thoracic region: Secondary | ICD-10-CM | POA: Diagnosis not present

## 2017-11-16 DIAGNOSIS — M5387 Other specified dorsopathies, lumbosacral region: Secondary | ICD-10-CM | POA: Diagnosis not present

## 2017-11-21 DIAGNOSIS — M5387 Other specified dorsopathies, lumbosacral region: Secondary | ICD-10-CM | POA: Diagnosis not present

## 2017-11-21 DIAGNOSIS — S29019A Strain of muscle and tendon of unspecified wall of thorax, initial encounter: Secondary | ICD-10-CM | POA: Diagnosis not present

## 2017-11-21 DIAGNOSIS — M9903 Segmental and somatic dysfunction of lumbar region: Secondary | ICD-10-CM | POA: Diagnosis not present

## 2017-11-21 DIAGNOSIS — S39012A Strain of muscle, fascia and tendon of lower back, initial encounter: Secondary | ICD-10-CM | POA: Diagnosis not present

## 2017-11-21 DIAGNOSIS — M9902 Segmental and somatic dysfunction of thoracic region: Secondary | ICD-10-CM | POA: Diagnosis not present

## 2017-11-23 DIAGNOSIS — Z1339 Encounter for screening examination for other mental health and behavioral disorders: Secondary | ICD-10-CM | POA: Diagnosis not present

## 2017-11-23 DIAGNOSIS — M5387 Other specified dorsopathies, lumbosacral region: Secondary | ICD-10-CM | POA: Diagnosis not present

## 2017-11-23 DIAGNOSIS — M9903 Segmental and somatic dysfunction of lumbar region: Secondary | ICD-10-CM | POA: Diagnosis not present

## 2017-11-23 DIAGNOSIS — S39012A Strain of muscle, fascia and tendon of lower back, initial encounter: Secondary | ICD-10-CM | POA: Diagnosis not present

## 2017-11-23 DIAGNOSIS — S29019A Strain of muscle and tendon of unspecified wall of thorax, initial encounter: Secondary | ICD-10-CM | POA: Diagnosis not present

## 2017-11-23 DIAGNOSIS — Z0000 Encounter for general adult medical examination without abnormal findings: Secondary | ICD-10-CM | POA: Diagnosis not present

## 2017-11-23 DIAGNOSIS — M9902 Segmental and somatic dysfunction of thoracic region: Secondary | ICD-10-CM | POA: Diagnosis not present

## 2017-11-24 ENCOUNTER — Other Ambulatory Visit: Payer: Self-pay

## 2017-11-24 NOTE — Patient Outreach (Signed)
[facility]) Care Management  11/24/2017  Alexander Bradley 03/21/1941 [id number]   Medication Adherence call to Alexander Bradley patient is showing past due under [facility] Ins.on Olmesartan 20 mg spoke with patient he said doctor Lovette Cliche took him off this medication he is no longer taking this medication patient said he is now taking Amlodipine 10 mg.   [place] Management Direct Dial [phone]  Fax [phone] [email or web address]

## 2017-11-25 DIAGNOSIS — M9903 Segmental and somatic dysfunction of lumbar region: Secondary | ICD-10-CM | POA: Diagnosis not present

## 2017-11-25 DIAGNOSIS — M9902 Segmental and somatic dysfunction of thoracic region: Secondary | ICD-10-CM | POA: Diagnosis not present

## 2017-11-25 DIAGNOSIS — S29019A Strain of muscle and tendon of unspecified wall of thorax, initial encounter: Secondary | ICD-10-CM | POA: Diagnosis not present

## 2017-11-25 DIAGNOSIS — S39012A Strain of muscle, fascia and tendon of lower back, initial encounter: Secondary | ICD-10-CM | POA: Diagnosis not present

## 2017-11-25 DIAGNOSIS — M5387 Other specified dorsopathies, lumbosacral region: Secondary | ICD-10-CM | POA: Diagnosis not present

## 2017-11-28 DIAGNOSIS — M9903 Segmental and somatic dysfunction of lumbar region: Secondary | ICD-10-CM | POA: Diagnosis not present

## 2017-11-28 DIAGNOSIS — S39012A Strain of muscle, fascia and tendon of lower back, initial encounter: Secondary | ICD-10-CM | POA: Diagnosis not present

## 2017-11-28 DIAGNOSIS — M5387 Other specified dorsopathies, lumbosacral region: Secondary | ICD-10-CM | POA: Diagnosis not present

## 2017-11-28 DIAGNOSIS — S29019A Strain of muscle and tendon of unspecified wall of thorax, initial encounter: Secondary | ICD-10-CM | POA: Diagnosis not present

## 2017-11-28 DIAGNOSIS — M9902 Segmental and somatic dysfunction of thoracic region: Secondary | ICD-10-CM | POA: Diagnosis not present

## 2017-12-01 DIAGNOSIS — M545 Low back pain: Secondary | ICD-10-CM | POA: Diagnosis not present

## 2017-12-01 DIAGNOSIS — R2689 Other abnormalities of gait and mobility: Secondary | ICD-10-CM | POA: Diagnosis not present

## 2017-12-07 DIAGNOSIS — M545 Low back pain: Secondary | ICD-10-CM | POA: Diagnosis not present

## 2017-12-07 DIAGNOSIS — R2689 Other abnormalities of gait and mobility: Secondary | ICD-10-CM | POA: Diagnosis not present

## 2017-12-09 DIAGNOSIS — R2689 Other abnormalities of gait and mobility: Secondary | ICD-10-CM | POA: Diagnosis not present

## 2017-12-09 DIAGNOSIS — M545 Low back pain: Secondary | ICD-10-CM | POA: Diagnosis not present

## 2017-12-13 DIAGNOSIS — M545 Low back pain: Secondary | ICD-10-CM | POA: Diagnosis not present

## 2017-12-13 DIAGNOSIS — R2689 Other abnormalities of gait and mobility: Secondary | ICD-10-CM | POA: Diagnosis not present

## 2017-12-15 DIAGNOSIS — R2689 Other abnormalities of gait and mobility: Secondary | ICD-10-CM | POA: Diagnosis not present

## 2017-12-15 DIAGNOSIS — M545 Low back pain: Secondary | ICD-10-CM | POA: Diagnosis not present

## 2017-12-21 DIAGNOSIS — R2689 Other abnormalities of gait and mobility: Secondary | ICD-10-CM | POA: Diagnosis not present

## 2017-12-21 DIAGNOSIS — M545 Low back pain: Secondary | ICD-10-CM | POA: Diagnosis not present

## 2018-02-01 DIAGNOSIS — R2689 Other abnormalities of gait and mobility: Secondary | ICD-10-CM | POA: Diagnosis not present

## 2018-02-01 DIAGNOSIS — M545 Low back pain: Secondary | ICD-10-CM | POA: Diagnosis not present

## 2018-02-08 DIAGNOSIS — R2689 Other abnormalities of gait and mobility: Secondary | ICD-10-CM | POA: Diagnosis not present

## 2018-02-08 DIAGNOSIS — M545 Low back pain: Secondary | ICD-10-CM | POA: Diagnosis not present

## 2018-03-13 ENCOUNTER — Ambulatory Visit: Payer: Medicare Other | Admitting: Neurology

## 2018-04-08 DIAGNOSIS — M7020 Olecranon bursitis, unspecified elbow: Secondary | ICD-10-CM | POA: Diagnosis not present

## 2018-04-11 DIAGNOSIS — R600 Localized edema: Secondary | ICD-10-CM | POA: Diagnosis not present

## 2018-04-11 DIAGNOSIS — M25421 Effusion, right elbow: Secondary | ICD-10-CM | POA: Diagnosis not present

## 2018-04-26 DIAGNOSIS — H26493 Other secondary cataract, bilateral: Secondary | ICD-10-CM | POA: Diagnosis not present

## 2018-04-26 DIAGNOSIS — H353131 Nonexudative age-related macular degeneration, bilateral, early dry stage: Secondary | ICD-10-CM | POA: Diagnosis not present

## 2018-05-01 DIAGNOSIS — I700 Atherosclerosis of aorta: Secondary | ICD-10-CM | POA: Diagnosis not present

## 2018-05-01 DIAGNOSIS — K5730 Diverticulosis of large intestine without perforation or abscess without bleeding: Secondary | ICD-10-CM | POA: Diagnosis not present

## 2018-05-01 DIAGNOSIS — K862 Cyst of pancreas: Secondary | ICD-10-CM | POA: Diagnosis not present

## 2018-05-01 DIAGNOSIS — I77811 Abdominal aortic ectasia: Secondary | ICD-10-CM | POA: Diagnosis not present

## 2018-05-01 DIAGNOSIS — K869 Disease of pancreas, unspecified: Secondary | ICD-10-CM | POA: Diagnosis not present

## 2018-05-04 DIAGNOSIS — I10 Essential (primary) hypertension: Secondary | ICD-10-CM | POA: Diagnosis not present

## 2018-05-04 DIAGNOSIS — K869 Disease of pancreas, unspecified: Secondary | ICD-10-CM | POA: Diagnosis not present

## 2018-06-05 ENCOUNTER — Ambulatory Visit: Payer: Medicare Other | Admitting: Neurology

## 2018-06-08 DIAGNOSIS — M25561 Pain in right knee: Secondary | ICD-10-CM | POA: Diagnosis not present

## 2018-06-08 DIAGNOSIS — I10 Essential (primary) hypertension: Secondary | ICD-10-CM | POA: Diagnosis not present

## 2018-06-08 DIAGNOSIS — R600 Localized edema: Secondary | ICD-10-CM | POA: Diagnosis not present

## 2018-06-09 DIAGNOSIS — R600 Localized edema: Secondary | ICD-10-CM | POA: Diagnosis not present

## 2018-06-19 DIAGNOSIS — I10 Essential (primary) hypertension: Secondary | ICD-10-CM | POA: Diagnosis not present

## 2018-06-19 DIAGNOSIS — R600 Localized edema: Secondary | ICD-10-CM | POA: Diagnosis not present

## 2018-08-07 ENCOUNTER — Other Ambulatory Visit: Payer: Self-pay

## 2018-08-07 NOTE — Patient Outreach (Signed)
[facility]) Care Management  08/07/2018  Courtenay Creger [birth year] [id number]   Medication Adherence call to Mr. Alexander Bradley left a message for patient to call back patient is due on Olmesartan 20 mg. Mr. Alexander Bradley is showing past due under [place].   [place] Management Direct Dial [phone]  Fax [phone] [email or web address]

## 2018-08-09 ENCOUNTER — Other Ambulatory Visit: Payer: Self-pay

## 2018-08-09 DIAGNOSIS — I8310 Varicose veins of unspecified lower extremity with inflammation: Secondary | ICD-10-CM | POA: Diagnosis not present

## 2018-08-09 DIAGNOSIS — L821 Other seborrheic keratosis: Secondary | ICD-10-CM | POA: Diagnosis not present

## 2018-08-09 DIAGNOSIS — L578 Other skin changes due to chronic exposure to nonionizing radiation: Secondary | ICD-10-CM | POA: Diagnosis not present

## 2018-08-09 DIAGNOSIS — R233 Spontaneous ecchymoses: Secondary | ICD-10-CM | POA: Diagnosis not present

## 2018-08-09 DIAGNOSIS — L570 Actinic keratosis: Secondary | ICD-10-CM | POA: Diagnosis not present

## 2018-08-09 NOTE — Patient Outreach (Signed)
[facility]) Care Management  08/09/2018  Kelden Lavallee 12-04-1941 [id number]   Mr. Kittel call back he wants a refill on his Olmesartan 20 mg he ask if we can call [facility] and Order it for him, call [facility]  they will have it ready for patient he has one more refill after this.Mr. Boyadjian is showing past due under [facility].  [facility] Management Direct Dial [phone]  Fax [phone] [email or web address]

## 2018-08-11 DIAGNOSIS — R2689 Other abnormalities of gait and mobility: Secondary | ICD-10-CM | POA: Diagnosis not present

## 2018-08-11 DIAGNOSIS — R2681 Unsteadiness on feet: Secondary | ICD-10-CM | POA: Diagnosis not present

## 2018-08-11 DIAGNOSIS — M6281 Muscle weakness (generalized): Secondary | ICD-10-CM | POA: Diagnosis not present

## 2018-08-17 DIAGNOSIS — M6281 Muscle weakness (generalized): Secondary | ICD-10-CM | POA: Diagnosis not present

## 2018-08-17 DIAGNOSIS — R2681 Unsteadiness on feet: Secondary | ICD-10-CM | POA: Diagnosis not present

## 2018-08-17 DIAGNOSIS — R2689 Other abnormalities of gait and mobility: Secondary | ICD-10-CM | POA: Diagnosis not present

## 2018-10-03 NOTE — Progress Notes (Addendum)
NEUROLOGY FOLLOW UP OFFICE NOTE  Alexander Bradley [id number]  HISTORY OF PRESENT ILLNESS: Alexander Bradley is a 77 year old right-handed male with hypertension who follows up for Alzheimer's disease.  He is accompanied by his son who supplements history.  UPDATE: A year ago today, his wife passed away from Alzheimer's disease.   He now lives in an independent living apartment facility in [place].  He was moved out of his home due to a mold issue.  He does have care available if needed.  His son checks up on his medications.  Overall, he is able to perform ADLs.  He still drives only in a couple of miles radius from home in [place].     HISTORY: Since summer 2017, he has had noticeable short term memory deficits.  Specifically, he would easily forget conversations after a day.  Subsequently, he started having trouble remembering to take his medications.  He also has had trouble keeping up with his bills and now his son has to assist with his finances.  His wife has Alzheimer's dementia, which has significantly progressed over the past year.  She now lives in a SNF and he currently is living by himself.  He is still driving, although only locally around his hometown of [place], and only to his son's house in [place].  He has not gotten disoriented on familiar routes.  He prepares his own meals and manages simple ADLs.  Other than medication and finances, he is independent with complex ADLs.  While he previously was stressed and had a little depressed mood caring for his wife, he is doing much better.  He has not had change in personality.  He has not exhibited combativeness or confusion.  He has not had hallucinations or delusions.     Also, he was evaluated by the eye doctor a few months ago and was noted to have horizontal nystagmus.  He denies double vision, slurred speech, trouble swallowing, unsteady gait/falls or unilateral numbness or weakness.   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  His mother had Alzheimer's dementia.   MRI of brain from 02/11/17 reportedly showed atrophy and chronic small vessel ischemic changes. 02/18/17:  Sed Rate 19, RPR negative. He has history of B12 deficiency and has undergone injections. MMSE from 04/26/17 was 27/30.   Medications include:  amlodipine, lisinopril, Benicar, Xarelto (for DVT), ASA 81mg .   He was previously on Aricept and Namenda but was taken off of them.  PAST MEDICAL HISTORY: No past medical history on file.  MEDICATIONS: Current Outpatient Medications on File Prior to Visit  Medication Sig Dispense Refill  . amLODipine (NORVASC) 5 MG tablet Take 5 mg by mouth daily.    Marland Kitchen aspirin EC 81 MG tablet Take 81 mg by mouth daily.    . memantine (NAMENDA) 5 MG tablet Take 5 mg by mouth 4 (four) times daily.    Marland Kitchen olmesartan (BENICAR) 20 MG tablet Take 20 mg by mouth daily.    . rivaroxaban (XARELTO) 20 MG TABS tablet Take 20 mg by mouth as directed.     No current facility-administered medications on file prior to visit.     ALLERGIES: Not on File  FAMILY HISTORY: Alzheimer's disease:  Mother  SOCIAL HISTORY: Social History   Socioeconomic History  . Marital status: Married    Spouse name: Not on file  . Number of children: Not on file  . Years of education: BS  .  Highest education level: Not on file  Occupational History  . Occupation: retired  Scientific laboratory technician  . Financial resource strain: Not on file  . Food insecurity:    Worry: Not on file    Inability: Not on file  . Transportation needs:    Medical: Not on file    Non-medical: Not on file  Tobacco Use  . Smoking status: Never Smoker  . Smokeless tobacco: Never Used  Substance and Sexual Activity  . Alcohol use: No  . Drug use: No  . Sexual activity: Not on file  Lifestyle  . Physical activity:    Days per week: Not on file    Minutes per session: Not on file  . Stress: Not on file  Relationships  . Social  connections:    Talks on phone: Not on file    Gets together: Not on file    Attends religious service: Not on file    Active member of club or organization: Not on file    Attends meetings of clubs or organizations: Not on file    Relationship status: Not on file  . Intimate partner violence:    Fear of current or ex partner: Not on file    Emotionally abused: Not on file    Physically abused: Not on file    Forced sexual activity: Not on file  Other Topics Concern  . Not on file  Social History Narrative   Married for 31 yrs, wife is now in a SNF, he lives alone, mentioned he does not like it at all.   Has a one story home, no pets.   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, was in [employer]    [place]: Constitutional: No fevers, chills, or sweats, no generalized fatigue, change in appetite Eyes: No visual changes, double vision, eye pain Ear, nose and throat: No hearing loss, ear pain, nasal congestion, sore throat Cardiovascular: No chest pain, palpitations Respiratory:  No shortness of breath at rest or with exertion, wheezes GastrointestinaI: No nausea, vomiting, diarrhea, abdominal pain, fecal incontinence Genitourinary:  No dysuria, urinary retention or frequency Musculoskeletal:  No neck pain, back pain Integumentary: No rash, pruritus, skin lesions Neurological: as above Psychiatric: No depression, insomnia, anxiety Endocrine: No palpitations, fatigue, diaphoresis, mood swings, change in appetite, change in weight, increased thirst Hematologic/Lymphatic:  No purpura, petechiae. Allergic/Immunologic: no itchy/runny eyes, nasal congestion, recent allergic reactions, rashes  PHYSICAL EXAM: Blood pressure (!) 146/86, pulse 66, height 5\' 10"  (1.778 m), weight 233 lb (105.7 kg), SpO2 93 %. General: No acute distress.  Patient appears well-groomed.   Head:  Normocephalic/atraumatic Eyes:  Fundi examined but not visualized Neck:  supple, no paraspinal tenderness, full range of motion Heart:  Regular rate and rhythm Lungs:  Clear to auscultation bilaterally Back: No paraspinal tenderness Neurological Exam: alert and oriented to person, place, and time. Attention span and concentration intact, recent memory reduced, remote memory intact, fund of knowledge intact.  Speech fluent and not dysarthric, language intact.  Unable to correctly complete the Trail Making Test, copy a cube or draw a clock. MMSE - Mini Mental State Exam 10/04/2018  Orientation to time 5  Orientation to Place 4  Registration 3  Attention/ Calculation 3  Recall 3  Language- name 2 objects 2  Language- repeat 1  Language- follow 3 step command 3  Language- read & follow direction 1  Write a sentence 1  Copy design 0  Total score 26   CN II-XII intact. Bulk and tone normal, muscle strength 5/5 throughout.  Sensation to light touch ntact.  Deep tendon reflexes trace throughout.  Finger to nose and heel to shin testing intact.  Gait steady, Romberg negative.  IMPRESSION: Alzheimer's dementia  PLAN: 1.  We will start Namenda:  5mg  at bedtime for 1 week, then 5mg  twice daily for 1 week, then 10mg  twice daily 2.  Advised to discontinue driving as he exhibits significant impairment in visuospatial and executive function. 3.  Resources provided. 4.  Follow up in 6 months.  25 minutes spent face to face with patient, over 50% spent discussing diagnosis and management.  Metta Clines, DO  CC: Angelina Sheriff, MD

## 2018-10-04 ENCOUNTER — Encounter: Payer: Self-pay | Admitting: Neurology

## 2018-10-04 ENCOUNTER — Ambulatory Visit: Payer: Medicare Other | Admitting: Neurology

## 2018-10-04 VITALS — BP 146/86 | HR 66 | Ht 70.0 in | Wt 233.0 lb

## 2018-10-04 DIAGNOSIS — F0280 Dementia in other diseases classified elsewhere without behavioral disturbance: Secondary | ICD-10-CM

## 2018-10-04 DIAGNOSIS — G301 Alzheimer's disease with late onset: Secondary | ICD-10-CM

## 2018-10-04 MED ORDER — MEMANTINE HCL 10 MG PO TABS: ORAL_TABLET | ORAL | 0 refills | Status: DC

## 2018-10-04 NOTE — Patient Instructions (Signed)
1.  Start Namenda (memantine) 10mg  tablets.  Take 1/2 tablet at bedtime for 7 days, then 1/2 tablet twice daily for 7 days, then 1 tablet twice daily.   Side effects include dizziness, headache, diarrhea or constipation.  Call with any questions or concerns. 2.  It is my assessment that you should discontinue driving. 3.  Follow up in 6 months.

## 2019-01-30 DIAGNOSIS — H353132 Nonexudative age-related macular degeneration, bilateral, intermediate dry stage: Secondary | ICD-10-CM | POA: Diagnosis not present

## 2019-02-22 DIAGNOSIS — R7302 Impaired glucose tolerance (oral): Secondary | ICD-10-CM | POA: Diagnosis not present

## 2019-02-22 DIAGNOSIS — Z0000 Encounter for general adult medical examination without abnormal findings: Secondary | ICD-10-CM | POA: Diagnosis not present

## 2019-02-22 DIAGNOSIS — R600 Localized edema: Secondary | ICD-10-CM | POA: Diagnosis not present

## 2019-02-22 DIAGNOSIS — E782 Mixed hyperlipidemia: Secondary | ICD-10-CM | POA: Diagnosis not present

## 2019-02-22 DIAGNOSIS — I10 Essential (primary) hypertension: Secondary | ICD-10-CM | POA: Diagnosis not present

## 2019-02-22 DIAGNOSIS — Z79899 Other long term (current) drug therapy: Secondary | ICD-10-CM | POA: Diagnosis not present

## 2019-02-27 DIAGNOSIS — R600 Localized edema: Secondary | ICD-10-CM | POA: Diagnosis not present

## 2019-04-05 ENCOUNTER — Ambulatory Visit: Payer: Medicare Other | Admitting: Neurology

## 2019-07-20 DIAGNOSIS — D519 Vitamin B12 deficiency anemia, unspecified: Secondary | ICD-10-CM | POA: Diagnosis not present

## 2019-07-20 DIAGNOSIS — Z0000 Encounter for general adult medical examination without abnormal findings: Secondary | ICD-10-CM | POA: Diagnosis not present

## 2019-07-20 DIAGNOSIS — Z23 Encounter for immunization: Secondary | ICD-10-CM | POA: Diagnosis not present

## 2019-07-20 DIAGNOSIS — Z79899 Other long term (current) drug therapy: Secondary | ICD-10-CM | POA: Diagnosis not present

## 2019-07-20 DIAGNOSIS — K869 Disease of pancreas, unspecified: Secondary | ICD-10-CM | POA: Diagnosis not present

## 2019-07-23 NOTE — Progress Notes (Deleted)
NEUROLOGY FOLLOW UP OFFICE NOTE  [name] [id number]  HISTORY OF PRESENT ILLNESS: Alexander Bradley is an 78 year old right-handed male with hypertension who follows up for Alzheimer's disease.  He is accompanied by his son who supplements history.  UPDATE: He takes memantine 10mg  twice daily.  He lives in an independent living apartment facility in [place].  He was moved out of his home due to a mold issue.  He does have care available if needed.  His son checks up on his medications.  Overall, he is able to perform ADLs.  He still drives only in a couple of miles radius from home in [place].    HISTORY: Since summer 2017, he has had noticeable short term memory deficits.  Specifically, he would easily forget conversations after a day.  Subsequently, he started having trouble remembering to take his medications.  He also has had trouble keeping up with his bills and now his son has to assist with his finances.  His wife has Alzheimer's dementia, which has significantly progressed over the past year.  She now lives in a SNF and he currently is living by himself.  He is still driving, although only locally around his hometown of [place], and only to his son's house in [place].  He has not gotten disoriented on familiar routes.  He prepares his own meals and manages simple ADLs.  Other than medication and finances, he is independent with complex ADLs.  While he previously was stressed and had a little depressed mood caring for his wife, he is doing much better.  He has not had change in personality.  He has not exhibited combativeness or confusion.  He has not had hallucinations or delusions.    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  His mother had Alzheimer's dementia.  MRI of brain from 02/11/17 reportedly showed atrophy and chronic small vessel ischemic changes. 02/18/17:  Sed Rate 19, RPR negative. He has history of B12 deficiency and has  undergone injections.  He was previously on Aricept.   PAST MEDICAL HISTORY: No past medical history on file.  MEDICATIONS: Current Outpatient Medications on File Prior to Visit  Medication Sig Dispense Refill  . aspirin EC 81 MG tablet Take 81 mg by mouth daily.    . furosemide (LASIX) 40 MG tablet Take 40 mg by mouth every morning.  3  . memantine (NAMENDA) 10 MG tablet Take 0.5 tablet at bedtime for 7d, then 0.5 tablet twice daily for 7 days, then 1 tablet twice daily 60 tablet 0  . olmesartan (BENICAR) 20 MG tablet Take 20 mg by mouth daily.    . rivaroxaban (XARELTO) 20 MG TABS tablet Take 20 mg by mouth as directed.    . sertraline (ZOLOFT) 50 MG tablet Take 50 mg by mouth daily.  5   No current facility-administered medications on file prior to visit.     ALLERGIES: No Known Allergies  FAMILY HISTORY: No family history on file. ***.  SOCIAL HISTORY: Social History   Socioeconomic History  . Marital status: Married    Spouse name: Not on file  . Number of children: Not on file  . Years of education: BS  . Highest education level: Not on file  Occupational History  . Occupation: retired  Scientific laboratory technician  . Financial resource strain: Not on file  . Food insecurity    Worry: Not on file    Inability: Not on file  .  Transportation needs    Medical: Not on file    Non-medical: Not on file  Tobacco Use  . Smoking status: Never Smoker  . Smokeless tobacco: Never Used  Substance and Sexual Activity  . Alcohol use: No  . Drug use: No  . Sexual activity: Not on file  Lifestyle  . Physical activity    Days per week: Not on file    Minutes per session: Not on file  . Stress: Not on file  Relationships  . Social Herbalist on phone: Not on file    Gets together: Not on file    Attends religious service: Not on file    Active member of club or organization: Not on file    Attends meetings of clubs or organizations: Not on file    Relationship status:  Not on file  . Intimate partner violence    Fear of current or ex partner: Not on file    Emotionally abused: Not on file    Physically abused: Not on file    Forced sexual activity: Not on file  Other Topics Concern  . Not on file  Social History Narrative   Married for 55 yrs, wife is now in a SNF, he lives alone, mentioned he does not like it at all.   Has a one story home, no pets.   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, was in [employer]    [place]: Constitutional: No fevers, chills, or sweats, no generalized fatigue, change in appetite Eyes: No visual changes, double vision, eye pain Ear, nose and throat: No hearing loss, ear pain, nasal congestion, sore throat Cardiovascular: No chest pain, palpitations Respiratory:  No shortness of breath at rest or with exertion, wheezes GastrointestinaI: No nausea, vomiting, diarrhea, abdominal pain, fecal incontinence Genitourinary:  No dysuria, urinary retention or frequency Musculoskeletal:  No neck pain, back pain Integumentary: No rash, pruritus, skin lesions Neurological: as above Psychiatric: No depression, insomnia, anxiety Endocrine: No palpitations, fatigue, diaphoresis, mood swings, change in appetite, change in weight, increased thirst Hematologic/Lymphatic:  No purpura, petechiae. Allergic/Immunologic: no itchy/runny eyes, nasal congestion, recent allergic reactions, rashes  PHYSICAL EXAM: *** General: No acute distress.  Patient appears ***-groomed.   Head:  Normocephalic/atraumatic Eyes:  Fundi examined but not visualized Neck: supple, no paraspinal tenderness, full range of motion Heart:  Regular rate and rhythm Lungs:  Clear to auscultation bilaterally Back: No paraspinal tenderness Neurological Exam: alert and oriented to person, place, and time. Attention span and concentration intact, recent and remote memory intact, fund of knowledge intact.  Speech fluent and not  dysarthric, language intact.  CN II-XII intact. Bulk and tone normal, muscle strength 5/5 throughout.  Sensation to light touch, temperature and vibration intact.  Deep tendon reflexes 2+ throughout, toes downgoing.  Finger to nose and heel to shin testing intact.  Gait normal, Romberg negative.  IMPRESSION: ***  PLAN: ***  Metta Clines, DO  CC: ***

## 2019-07-24 ENCOUNTER — Ambulatory Visit: Payer: Medicare Other | Admitting: Neurology

## 2019-08-08 DIAGNOSIS — K8689 Other specified diseases of pancreas: Secondary | ICD-10-CM | POA: Diagnosis not present

## 2019-08-08 DIAGNOSIS — K869 Disease of pancreas, unspecified: Secondary | ICD-10-CM | POA: Diagnosis not present

## 2019-08-09 DIAGNOSIS — K869 Disease of pancreas, unspecified: Secondary | ICD-10-CM | POA: Diagnosis not present

## 2019-09-18 DIAGNOSIS — R2681 Unsteadiness on feet: Secondary | ICD-10-CM | POA: Diagnosis not present

## 2019-09-18 DIAGNOSIS — M545 Low back pain: Secondary | ICD-10-CM | POA: Diagnosis not present

## 2019-09-25 DIAGNOSIS — R2681 Unsteadiness on feet: Secondary | ICD-10-CM | POA: Diagnosis not present

## 2019-09-25 DIAGNOSIS — M545 Low back pain: Secondary | ICD-10-CM | POA: Diagnosis not present

## 2019-09-28 DIAGNOSIS — R2681 Unsteadiness on feet: Secondary | ICD-10-CM | POA: Diagnosis not present

## 2019-09-28 DIAGNOSIS — M545 Low back pain: Secondary | ICD-10-CM | POA: Diagnosis not present

## 2019-10-02 DIAGNOSIS — R2681 Unsteadiness on feet: Secondary | ICD-10-CM | POA: Diagnosis not present

## 2019-10-02 DIAGNOSIS — M545 Low back pain: Secondary | ICD-10-CM | POA: Diagnosis not present

## 2019-10-05 DIAGNOSIS — M545 Low back pain: Secondary | ICD-10-CM | POA: Diagnosis not present

## 2019-10-05 DIAGNOSIS — R2681 Unsteadiness on feet: Secondary | ICD-10-CM | POA: Diagnosis not present

## 2019-10-09 DIAGNOSIS — M545 Low back pain: Secondary | ICD-10-CM | POA: Diagnosis not present

## 2019-10-09 DIAGNOSIS — R2681 Unsteadiness on feet: Secondary | ICD-10-CM | POA: Diagnosis not present

## 2019-10-11 DIAGNOSIS — R2681 Unsteadiness on feet: Secondary | ICD-10-CM | POA: Diagnosis not present

## 2019-10-11 DIAGNOSIS — M545 Low back pain: Secondary | ICD-10-CM | POA: Diagnosis not present

## 2019-10-16 DIAGNOSIS — M545 Low back pain: Secondary | ICD-10-CM | POA: Diagnosis not present

## 2019-10-16 DIAGNOSIS — R2681 Unsteadiness on feet: Secondary | ICD-10-CM | POA: Diagnosis not present

## 2019-10-24 DIAGNOSIS — H539 Unspecified visual disturbance: Secondary | ICD-10-CM | POA: Diagnosis not present

## 2019-10-25 DIAGNOSIS — R2681 Unsteadiness on feet: Secondary | ICD-10-CM | POA: Diagnosis not present

## 2019-10-25 DIAGNOSIS — M545 Low back pain: Secondary | ICD-10-CM | POA: Diagnosis not present

## 2019-10-31 DIAGNOSIS — R2681 Unsteadiness on feet: Secondary | ICD-10-CM | POA: Diagnosis not present

## 2019-10-31 DIAGNOSIS — M545 Low back pain: Secondary | ICD-10-CM | POA: Diagnosis not present

## 2019-11-08 DIAGNOSIS — M545 Low back pain: Secondary | ICD-10-CM | POA: Diagnosis not present

## 2019-11-08 DIAGNOSIS — R2681 Unsteadiness on feet: Secondary | ICD-10-CM | POA: Diagnosis not present

## 2019-11-15 DIAGNOSIS — M545 Low back pain: Secondary | ICD-10-CM | POA: Diagnosis not present

## 2019-11-15 DIAGNOSIS — R2681 Unsteadiness on feet: Secondary | ICD-10-CM | POA: Diagnosis not present

## 2019-11-21 DIAGNOSIS — R2681 Unsteadiness on feet: Secondary | ICD-10-CM | POA: Diagnosis not present

## 2019-11-21 DIAGNOSIS — M545 Low back pain: Secondary | ICD-10-CM | POA: Diagnosis not present

## 2020-04-17 DIAGNOSIS — C44319 Basal cell carcinoma of skin of other parts of face: Secondary | ICD-10-CM | POA: Diagnosis not present

## 2020-05-20 DIAGNOSIS — S31829A Unspecified open wound of left buttock, initial encounter: Secondary | ICD-10-CM | POA: Diagnosis not present

## 2020-05-28 DIAGNOSIS — R2681 Unsteadiness on feet: Secondary | ICD-10-CM | POA: Diagnosis not present

## 2020-05-28 DIAGNOSIS — M545 Low back pain: Secondary | ICD-10-CM | POA: Diagnosis not present

## 2020-06-03 DIAGNOSIS — M545 Low back pain: Secondary | ICD-10-CM | POA: Diagnosis not present

## 2020-06-03 DIAGNOSIS — R2681 Unsteadiness on feet: Secondary | ICD-10-CM | POA: Diagnosis not present

## 2020-06-05 DIAGNOSIS — R2681 Unsteadiness on feet: Secondary | ICD-10-CM | POA: Diagnosis not present

## 2020-06-05 DIAGNOSIS — M545 Low back pain: Secondary | ICD-10-CM | POA: Diagnosis not present

## 2020-06-10 DIAGNOSIS — M545 Low back pain: Secondary | ICD-10-CM | POA: Diagnosis not present

## 2020-06-10 DIAGNOSIS — R2681 Unsteadiness on feet: Secondary | ICD-10-CM | POA: Diagnosis not present

## 2020-06-12 DIAGNOSIS — M545 Low back pain: Secondary | ICD-10-CM | POA: Diagnosis not present

## 2020-06-12 DIAGNOSIS — R2681 Unsteadiness on feet: Secondary | ICD-10-CM | POA: Diagnosis not present

## 2020-06-16 DIAGNOSIS — R2681 Unsteadiness on feet: Secondary | ICD-10-CM | POA: Diagnosis not present

## 2020-06-16 DIAGNOSIS — M545 Low back pain: Secondary | ICD-10-CM | POA: Diagnosis not present

## 2020-06-19 DIAGNOSIS — R2681 Unsteadiness on feet: Secondary | ICD-10-CM | POA: Diagnosis not present

## 2020-06-19 DIAGNOSIS — M545 Low back pain: Secondary | ICD-10-CM | POA: Diagnosis not present

## 2020-07-24 DIAGNOSIS — Z0000 Encounter for general adult medical examination without abnormal findings: Secondary | ICD-10-CM | POA: Diagnosis not present

## 2020-07-24 DIAGNOSIS — E782 Mixed hyperlipidemia: Secondary | ICD-10-CM | POA: Diagnosis not present

## 2020-07-24 DIAGNOSIS — I10 Essential (primary) hypertension: Secondary | ICD-10-CM | POA: Diagnosis not present

## 2020-07-24 DIAGNOSIS — Z79899 Other long term (current) drug therapy: Secondary | ICD-10-CM | POA: Diagnosis not present

## 2020-07-24 DIAGNOSIS — Z9181 History of falling: Secondary | ICD-10-CM | POA: Diagnosis not present

## 2020-07-24 DIAGNOSIS — D519 Vitamin B12 deficiency anemia, unspecified: Secondary | ICD-10-CM | POA: Diagnosis not present

## 2020-08-04 DIAGNOSIS — S50311A Abrasion of right elbow, initial encounter: Secondary | ICD-10-CM | POA: Diagnosis not present

## 2020-08-04 DIAGNOSIS — M47813 Spondylosis without myelopathy or radiculopathy, cervicothoracic region: Secondary | ICD-10-CM | POA: Diagnosis not present

## 2020-08-04 DIAGNOSIS — S199XXA Unspecified injury of neck, initial encounter: Secondary | ICD-10-CM | POA: Diagnosis not present

## 2020-08-04 DIAGNOSIS — M2578 Osteophyte, vertebrae: Secondary | ICD-10-CM | POA: Diagnosis not present

## 2020-08-04 DIAGNOSIS — Z87891 Personal history of nicotine dependence: Secondary | ICD-10-CM | POA: Diagnosis not present

## 2020-08-04 DIAGNOSIS — I129 Hypertensive chronic kidney disease with stage 1 through stage 4 chronic kidney disease, or unspecified chronic kidney disease: Secondary | ICD-10-CM | POA: Diagnosis not present

## 2020-08-04 DIAGNOSIS — R531 Weakness: Secondary | ICD-10-CM | POA: Diagnosis not present

## 2020-08-04 DIAGNOSIS — M47812 Spondylosis without myelopathy or radiculopathy, cervical region: Secondary | ICD-10-CM | POA: Diagnosis not present

## 2020-08-04 DIAGNOSIS — S0990XA Unspecified injury of head, initial encounter: Secondary | ICD-10-CM | POA: Diagnosis not present

## 2020-08-04 DIAGNOSIS — N189 Chronic kidney disease, unspecified: Secondary | ICD-10-CM | POA: Diagnosis not present

## 2020-12-25 DIAGNOSIS — E782 Mixed hyperlipidemia: Secondary | ICD-10-CM | POA: Diagnosis not present

## 2020-12-25 DIAGNOSIS — D519 Vitamin B12 deficiency anemia, unspecified: Secondary | ICD-10-CM | POA: Diagnosis not present

## 2020-12-25 DIAGNOSIS — I10 Essential (primary) hypertension: Secondary | ICD-10-CM | POA: Diagnosis not present

## 2021-01-25 DIAGNOSIS — D519 Vitamin B12 deficiency anemia, unspecified: Secondary | ICD-10-CM | POA: Diagnosis not present

## 2021-01-25 DIAGNOSIS — E782 Mixed hyperlipidemia: Secondary | ICD-10-CM | POA: Diagnosis not present

## 2021-01-25 DIAGNOSIS — G629 Polyneuropathy, unspecified: Secondary | ICD-10-CM | POA: Diagnosis not present

## 2021-03-11 DIAGNOSIS — H5203 Hypermetropia, bilateral: Secondary | ICD-10-CM | POA: Diagnosis not present

## 2021-03-11 DIAGNOSIS — Z961 Presence of intraocular lens: Secondary | ICD-10-CM | POA: Diagnosis not present

## 2021-03-11 DIAGNOSIS — H524 Presbyopia: Secondary | ICD-10-CM | POA: Diagnosis not present

## 2021-06-02 DIAGNOSIS — R531 Weakness: Secondary | ICD-10-CM | POA: Diagnosis not present

## 2021-06-02 DIAGNOSIS — M25561 Pain in right knee: Secondary | ICD-10-CM | POA: Diagnosis not present

## 2021-06-02 DIAGNOSIS — M25661 Stiffness of right knee, not elsewhere classified: Secondary | ICD-10-CM | POA: Diagnosis not present

## 2021-06-09 DIAGNOSIS — R531 Weakness: Secondary | ICD-10-CM | POA: Diagnosis not present

## 2021-06-09 DIAGNOSIS — M25561 Pain in right knee: Secondary | ICD-10-CM | POA: Diagnosis not present

## 2021-06-09 DIAGNOSIS — M25661 Stiffness of right knee, not elsewhere classified: Secondary | ICD-10-CM | POA: Diagnosis not present

## 2021-06-11 DIAGNOSIS — M25561 Pain in right knee: Secondary | ICD-10-CM | POA: Diagnosis not present

## 2021-06-11 DIAGNOSIS — M25661 Stiffness of right knee, not elsewhere classified: Secondary | ICD-10-CM | POA: Diagnosis not present

## 2021-06-11 DIAGNOSIS — R531 Weakness: Secondary | ICD-10-CM | POA: Diagnosis not present

## 2021-07-26 DIAGNOSIS — E782 Mixed hyperlipidemia: Secondary | ICD-10-CM | POA: Diagnosis not present

## 2021-07-26 DIAGNOSIS — D510 Vitamin B12 deficiency anemia due to intrinsic factor deficiency: Secondary | ICD-10-CM | POA: Diagnosis not present

## 2021-07-26 DIAGNOSIS — G629 Polyneuropathy, unspecified: Secondary | ICD-10-CM | POA: Diagnosis not present

## 2021-07-28 DIAGNOSIS — D519 Vitamin B12 deficiency anemia, unspecified: Secondary | ICD-10-CM | POA: Diagnosis not present

## 2021-07-28 DIAGNOSIS — Z79899 Other long term (current) drug therapy: Secondary | ICD-10-CM | POA: Diagnosis not present

## 2021-07-28 DIAGNOSIS — E782 Mixed hyperlipidemia: Secondary | ICD-10-CM | POA: Diagnosis not present

## 2021-07-28 DIAGNOSIS — Z0000 Encounter for general adult medical examination without abnormal findings: Secondary | ICD-10-CM | POA: Diagnosis not present

## 2021-09-09 DIAGNOSIS — D649 Anemia, unspecified: Secondary | ICD-10-CM | POA: Diagnosis not present

## 2021-10-05 ENCOUNTER — Other Ambulatory Visit: Payer: Medicare Other

## 2021-10-05 ENCOUNTER — Other Ambulatory Visit: Payer: Self-pay | Admitting: Oncology

## 2021-10-05 ENCOUNTER — Ambulatory Visit: Payer: Medicare Other | Admitting: Oncology

## 2021-10-05 DIAGNOSIS — K869 Disease of pancreas, unspecified: Secondary | ICD-10-CM

## 2021-12-09 DIAGNOSIS — R0902 Hypoxemia: Secondary | ICD-10-CM | POA: Diagnosis not present

## 2021-12-09 DIAGNOSIS — Z66 Do not resuscitate: Secondary | ICD-10-CM | POA: Diagnosis not present

## 2021-12-09 DIAGNOSIS — E569 Vitamin deficiency, unspecified: Secondary | ICD-10-CM | POA: Diagnosis not present

## 2021-12-09 DIAGNOSIS — R6889 Other general symptoms and signs: Secondary | ICD-10-CM | POA: Diagnosis not present

## 2021-12-09 DIAGNOSIS — I10 Essential (primary) hypertension: Secondary | ICD-10-CM | POA: Diagnosis not present

## 2021-12-09 DIAGNOSIS — W19XXXA Unspecified fall, initial encounter: Secondary | ICD-10-CM | POA: Diagnosis not present

## 2021-12-09 DIAGNOSIS — S199XXA Unspecified injury of neck, initial encounter: Secondary | ICD-10-CM | POA: Diagnosis not present

## 2021-12-09 DIAGNOSIS — G319 Degenerative disease of nervous system, unspecified: Secondary | ICD-10-CM | POA: Diagnosis not present

## 2021-12-09 DIAGNOSIS — D72829 Elevated white blood cell count, unspecified: Secondary | ICD-10-CM | POA: Diagnosis not present

## 2021-12-09 DIAGNOSIS — R0602 Shortness of breath: Secondary | ICD-10-CM | POA: Diagnosis not present

## 2021-12-09 DIAGNOSIS — M47813 Spondylosis without myelopathy or radiculopathy, cervicothoracic region: Secondary | ICD-10-CM | POA: Diagnosis not present

## 2021-12-09 DIAGNOSIS — R296 Repeated falls: Secondary | ICD-10-CM | POA: Diagnosis not present

## 2021-12-09 DIAGNOSIS — M47812 Spondylosis without myelopathy or radiculopathy, cervical region: Secondary | ICD-10-CM | POA: Diagnosis not present

## 2021-12-09 DIAGNOSIS — G309 Alzheimer's disease, unspecified: Secondary | ICD-10-CM | POA: Diagnosis not present

## 2021-12-09 DIAGNOSIS — Z743 Need for continuous supervision: Secondary | ICD-10-CM | POA: Diagnosis not present

## 2021-12-09 DIAGNOSIS — I160 Hypertensive urgency: Secondary | ICD-10-CM | POA: Diagnosis not present

## 2021-12-09 DIAGNOSIS — R4781 Slurred speech: Secondary | ICD-10-CM | POA: Diagnosis not present

## 2021-12-09 DIAGNOSIS — M6282 Rhabdomyolysis: Secondary | ICD-10-CM | POA: Diagnosis not present

## 2021-12-09 DIAGNOSIS — G8929 Other chronic pain: Secondary | ICD-10-CM | POA: Diagnosis not present

## 2021-12-09 DIAGNOSIS — E7800 Pure hypercholesterolemia, unspecified: Secondary | ICD-10-CM | POA: Diagnosis not present

## 2021-12-09 DIAGNOSIS — G4733 Obstructive sleep apnea (adult) (pediatric): Secondary | ICD-10-CM | POA: Diagnosis not present

## 2021-12-09 DIAGNOSIS — Z87891 Personal history of nicotine dependence: Secondary | ICD-10-CM | POA: Diagnosis not present

## 2021-12-09 DIAGNOSIS — D649 Anemia, unspecified: Secondary | ICD-10-CM | POA: Diagnosis not present

## 2021-12-09 DIAGNOSIS — Z86718 Personal history of other venous thrombosis and embolism: Secondary | ICD-10-CM | POA: Diagnosis not present

## 2021-12-09 DIAGNOSIS — E876 Hypokalemia: Secondary | ICD-10-CM | POA: Diagnosis not present

## 2021-12-09 DIAGNOSIS — J1282 Pneumonia due to coronavirus disease 2019: Secondary | ICD-10-CM | POA: Diagnosis not present

## 2021-12-09 DIAGNOSIS — I517 Cardiomegaly: Secondary | ICD-10-CM | POA: Diagnosis not present

## 2021-12-09 DIAGNOSIS — G9341 Metabolic encephalopathy: Secondary | ICD-10-CM | POA: Diagnosis not present

## 2021-12-09 DIAGNOSIS — I700 Atherosclerosis of aorta: Secondary | ICD-10-CM | POA: Diagnosis not present

## 2021-12-09 DIAGNOSIS — J9601 Acute respiratory failure with hypoxia: Secondary | ICD-10-CM | POA: Diagnosis not present

## 2021-12-09 DIAGNOSIS — N179 Acute kidney failure, unspecified: Secondary | ICD-10-CM | POA: Diagnosis not present

## 2021-12-09 DIAGNOSIS — S0990XA Unspecified injury of head, initial encounter: Secondary | ICD-10-CM | POA: Diagnosis not present

## 2021-12-09 DIAGNOSIS — U071 COVID-19: Secondary | ICD-10-CM | POA: Diagnosis not present

## 2021-12-09 DIAGNOSIS — I672 Cerebral atherosclerosis: Secondary | ICD-10-CM | POA: Diagnosis not present

## 2021-12-09 DIAGNOSIS — F02818 Dementia in other diseases classified elsewhere, unspecified severity, with other behavioral disturbance: Secondary | ICD-10-CM | POA: Diagnosis not present

## 2021-12-09 DIAGNOSIS — T796XXA Traumatic ischemia of muscle, initial encounter: Secondary | ICD-10-CM | POA: Diagnosis not present

## 2021-12-09 DIAGNOSIS — M1990 Unspecified osteoarthritis, unspecified site: Secondary | ICD-10-CM | POA: Diagnosis not present

## 2021-12-09 DIAGNOSIS — M25551 Pain in right hip: Secondary | ICD-10-CM | POA: Diagnosis not present

## 2021-12-09 DIAGNOSIS — R319 Hematuria, unspecified: Secondary | ICD-10-CM | POA: Diagnosis not present

## 2021-12-09 DIAGNOSIS — M25552 Pain in left hip: Secondary | ICD-10-CM | POA: Diagnosis not present

## 2021-12-09 DIAGNOSIS — E871 Hypo-osmolality and hyponatremia: Secondary | ICD-10-CM | POA: Diagnosis not present

## 2021-12-10 DIAGNOSIS — G9341 Metabolic encephalopathy: Secondary | ICD-10-CM | POA: Diagnosis not present

## 2021-12-10 DIAGNOSIS — T796XXA Traumatic ischemia of muscle, initial encounter: Secondary | ICD-10-CM | POA: Diagnosis not present

## 2021-12-10 DIAGNOSIS — J1282 Pneumonia due to coronavirus disease 2019: Secondary | ICD-10-CM | POA: Diagnosis not present

## 2021-12-11 DIAGNOSIS — J1282 Pneumonia due to coronavirus disease 2019: Secondary | ICD-10-CM | POA: Diagnosis not present

## 2021-12-11 DIAGNOSIS — I517 Cardiomegaly: Secondary | ICD-10-CM | POA: Diagnosis not present

## 2021-12-11 DIAGNOSIS — R0602 Shortness of breath: Secondary | ICD-10-CM | POA: Diagnosis not present

## 2021-12-11 DIAGNOSIS — T796XXA Traumatic ischemia of muscle, initial encounter: Secondary | ICD-10-CM | POA: Diagnosis not present

## 2021-12-11 DIAGNOSIS — G9341 Metabolic encephalopathy: Secondary | ICD-10-CM | POA: Diagnosis not present

## 2021-12-12 DIAGNOSIS — J1282 Pneumonia due to coronavirus disease 2019: Secondary | ICD-10-CM | POA: Diagnosis not present

## 2021-12-12 DIAGNOSIS — R296 Repeated falls: Secondary | ICD-10-CM | POA: Diagnosis not present

## 2021-12-12 DIAGNOSIS — G9341 Metabolic encephalopathy: Secondary | ICD-10-CM | POA: Diagnosis not present

## 2021-12-12 DIAGNOSIS — T796XXA Traumatic ischemia of muscle, initial encounter: Secondary | ICD-10-CM | POA: Diagnosis not present

## 2021-12-13 DIAGNOSIS — J1282 Pneumonia due to coronavirus disease 2019: Secondary | ICD-10-CM | POA: Diagnosis not present

## 2021-12-13 DIAGNOSIS — T796XXA Traumatic ischemia of muscle, initial encounter: Secondary | ICD-10-CM | POA: Diagnosis not present

## 2021-12-13 DIAGNOSIS — G9341 Metabolic encephalopathy: Secondary | ICD-10-CM | POA: Diagnosis not present

## 2021-12-14 DIAGNOSIS — T796XXA Traumatic ischemia of muscle, initial encounter: Secondary | ICD-10-CM | POA: Diagnosis not present

## 2021-12-14 DIAGNOSIS — G9341 Metabolic encephalopathy: Secondary | ICD-10-CM | POA: Diagnosis not present

## 2021-12-14 DIAGNOSIS — J1282 Pneumonia due to coronavirus disease 2019: Secondary | ICD-10-CM | POA: Diagnosis not present

## 2021-12-15 DIAGNOSIS — G9341 Metabolic encephalopathy: Secondary | ICD-10-CM | POA: Diagnosis not present

## 2021-12-15 DIAGNOSIS — J1282 Pneumonia due to coronavirus disease 2019: Secondary | ICD-10-CM | POA: Diagnosis not present

## 2021-12-15 DIAGNOSIS — T796XXA Traumatic ischemia of muscle, initial encounter: Secondary | ICD-10-CM | POA: Diagnosis not present

## 2021-12-16 DIAGNOSIS — G9341 Metabolic encephalopathy: Secondary | ICD-10-CM | POA: Diagnosis not present

## 2021-12-16 DIAGNOSIS — T796XXA Traumatic ischemia of muscle, initial encounter: Secondary | ICD-10-CM | POA: Diagnosis not present

## 2021-12-16 DIAGNOSIS — J1282 Pneumonia due to coronavirus disease 2019: Secondary | ICD-10-CM | POA: Diagnosis not present

## 2021-12-16 DIAGNOSIS — D72829 Elevated white blood cell count, unspecified: Secondary | ICD-10-CM | POA: Diagnosis not present

## 2021-12-16 DIAGNOSIS — D649 Anemia, unspecified: Secondary | ICD-10-CM | POA: Diagnosis not present

## 2021-12-16 DIAGNOSIS — U071 COVID-19: Secondary | ICD-10-CM | POA: Diagnosis not present

## 2021-12-17 DIAGNOSIS — J1282 Pneumonia due to coronavirus disease 2019: Secondary | ICD-10-CM | POA: Diagnosis not present

## 2021-12-17 DIAGNOSIS — G9341 Metabolic encephalopathy: Secondary | ICD-10-CM | POA: Diagnosis not present

## 2021-12-17 DIAGNOSIS — T796XXA Traumatic ischemia of muscle, initial encounter: Secondary | ICD-10-CM | POA: Diagnosis not present

## 2021-12-18 DIAGNOSIS — J1282 Pneumonia due to coronavirus disease 2019: Secondary | ICD-10-CM | POA: Diagnosis not present

## 2021-12-18 DIAGNOSIS — T796XXA Traumatic ischemia of muscle, initial encounter: Secondary | ICD-10-CM | POA: Diagnosis not present

## 2021-12-18 DIAGNOSIS — G9341 Metabolic encephalopathy: Secondary | ICD-10-CM | POA: Diagnosis not present

## 2021-12-19 DIAGNOSIS — G9341 Metabolic encephalopathy: Secondary | ICD-10-CM | POA: Diagnosis not present

## 2021-12-19 DIAGNOSIS — J1282 Pneumonia due to coronavirus disease 2019: Secondary | ICD-10-CM | POA: Diagnosis not present

## 2021-12-19 DIAGNOSIS — T796XXA Traumatic ischemia of muscle, initial encounter: Secondary | ICD-10-CM | POA: Diagnosis not present

## 2021-12-20 DIAGNOSIS — T796XXA Traumatic ischemia of muscle, initial encounter: Secondary | ICD-10-CM | POA: Diagnosis not present

## 2021-12-20 DIAGNOSIS — J1282 Pneumonia due to coronavirus disease 2019: Secondary | ICD-10-CM | POA: Diagnosis not present

## 2021-12-20 DIAGNOSIS — G9341 Metabolic encephalopathy: Secondary | ICD-10-CM | POA: Diagnosis not present

## 2021-12-21 DIAGNOSIS — T796XXA Traumatic ischemia of muscle, initial encounter: Secondary | ICD-10-CM | POA: Diagnosis not present

## 2021-12-21 DIAGNOSIS — J1282 Pneumonia due to coronavirus disease 2019: Secondary | ICD-10-CM | POA: Diagnosis not present

## 2021-12-21 DIAGNOSIS — G9341 Metabolic encephalopathy: Secondary | ICD-10-CM | POA: Diagnosis not present

## 2021-12-22 DIAGNOSIS — T796XXA Traumatic ischemia of muscle, initial encounter: Secondary | ICD-10-CM | POA: Diagnosis not present

## 2021-12-22 DIAGNOSIS — J1282 Pneumonia due to coronavirus disease 2019: Secondary | ICD-10-CM | POA: Diagnosis not present

## 2021-12-22 DIAGNOSIS — G9341 Metabolic encephalopathy: Secondary | ICD-10-CM | POA: Diagnosis not present

## 2021-12-23 DIAGNOSIS — G9341 Metabolic encephalopathy: Secondary | ICD-10-CM | POA: Diagnosis not present

## 2021-12-23 DIAGNOSIS — T796XXA Traumatic ischemia of muscle, initial encounter: Secondary | ICD-10-CM | POA: Diagnosis not present

## 2021-12-23 DIAGNOSIS — J1282 Pneumonia due to coronavirus disease 2019: Secondary | ICD-10-CM | POA: Diagnosis not present

## 2021-12-24 DIAGNOSIS — T796XXA Traumatic ischemia of muscle, initial encounter: Secondary | ICD-10-CM | POA: Diagnosis not present

## 2021-12-24 DIAGNOSIS — G9341 Metabolic encephalopathy: Secondary | ICD-10-CM | POA: Diagnosis not present

## 2021-12-24 DIAGNOSIS — J1282 Pneumonia due to coronavirus disease 2019: Secondary | ICD-10-CM | POA: Diagnosis not present

## 2021-12-25 DIAGNOSIS — T796XXA Traumatic ischemia of muscle, initial encounter: Secondary | ICD-10-CM | POA: Diagnosis not present

## 2021-12-25 DIAGNOSIS — J1282 Pneumonia due to coronavirus disease 2019: Secondary | ICD-10-CM | POA: Diagnosis not present

## 2021-12-25 DIAGNOSIS — G9341 Metabolic encephalopathy: Secondary | ICD-10-CM | POA: Diagnosis not present

## 2021-12-26 DIAGNOSIS — T796XXA Traumatic ischemia of muscle, initial encounter: Secondary | ICD-10-CM | POA: Diagnosis not present

## 2021-12-26 DIAGNOSIS — G9341 Metabolic encephalopathy: Secondary | ICD-10-CM | POA: Diagnosis not present

## 2021-12-26 DIAGNOSIS — J1282 Pneumonia due to coronavirus disease 2019: Secondary | ICD-10-CM | POA: Diagnosis not present

## 2021-12-27 DIAGNOSIS — T796XXA Traumatic ischemia of muscle, initial encounter: Secondary | ICD-10-CM | POA: Diagnosis not present

## 2021-12-27 DIAGNOSIS — J1282 Pneumonia due to coronavirus disease 2019: Secondary | ICD-10-CM | POA: Diagnosis not present

## 2021-12-27 DIAGNOSIS — G9341 Metabolic encephalopathy: Secondary | ICD-10-CM | POA: Diagnosis not present

## 2021-12-27 DIAGNOSIS — I10 Essential (primary) hypertension: Secondary | ICD-10-CM | POA: Diagnosis not present

## 2021-12-27 DIAGNOSIS — R319 Hematuria, unspecified: Secondary | ICD-10-CM | POA: Diagnosis not present

## 2021-12-28 DIAGNOSIS — J1282 Pneumonia due to coronavirus disease 2019: Secondary | ICD-10-CM | POA: Diagnosis not present

## 2021-12-28 DIAGNOSIS — T796XXA Traumatic ischemia of muscle, initial encounter: Secondary | ICD-10-CM | POA: Diagnosis not present

## 2021-12-28 DIAGNOSIS — G9341 Metabolic encephalopathy: Secondary | ICD-10-CM | POA: Diagnosis not present

## 2021-12-29 DIAGNOSIS — M6282 Rhabdomyolysis: Secondary | ICD-10-CM | POA: Diagnosis not present

## 2021-12-29 DIAGNOSIS — E569 Vitamin deficiency, unspecified: Secondary | ICD-10-CM | POA: Diagnosis not present

## 2021-12-29 DIAGNOSIS — J1282 Pneumonia due to coronavirus disease 2019: Secondary | ICD-10-CM | POA: Diagnosis not present

## 2021-12-29 DIAGNOSIS — D72829 Elevated white blood cell count, unspecified: Secondary | ICD-10-CM | POA: Diagnosis not present

## 2021-12-29 DIAGNOSIS — N179 Acute kidney failure, unspecified: Secondary | ICD-10-CM | POA: Diagnosis not present

## 2021-12-29 DIAGNOSIS — R739 Hyperglycemia, unspecified: Secondary | ICD-10-CM | POA: Diagnosis not present

## 2021-12-29 DIAGNOSIS — U071 COVID-19: Secondary | ICD-10-CM | POA: Diagnosis not present

## 2021-12-29 DIAGNOSIS — N390 Urinary tract infection, site not specified: Secondary | ICD-10-CM | POA: Diagnosis not present

## 2021-12-29 DIAGNOSIS — T796XXA Traumatic ischemia of muscle, initial encounter: Secondary | ICD-10-CM | POA: Diagnosis not present

## 2021-12-29 DIAGNOSIS — U099 Post covid-19 condition, unspecified: Secondary | ICD-10-CM | POA: Diagnosis not present

## 2021-12-29 DIAGNOSIS — Z86718 Personal history of other venous thrombosis and embolism: Secondary | ICD-10-CM | POA: Diagnosis not present

## 2021-12-29 DIAGNOSIS — K649 Unspecified hemorrhoids: Secondary | ICD-10-CM | POA: Diagnosis not present

## 2021-12-29 DIAGNOSIS — K922 Gastrointestinal hemorrhage, unspecified: Secondary | ICD-10-CM | POA: Diagnosis not present

## 2021-12-29 DIAGNOSIS — R319 Hematuria, unspecified: Secondary | ICD-10-CM | POA: Diagnosis not present

## 2021-12-29 DIAGNOSIS — K5900 Constipation, unspecified: Secondary | ICD-10-CM | POA: Diagnosis not present

## 2021-12-29 DIAGNOSIS — D649 Anemia, unspecified: Secondary | ICD-10-CM | POA: Diagnosis not present

## 2021-12-29 DIAGNOSIS — I10 Essential (primary) hypertension: Secondary | ICD-10-CM | POA: Diagnosis not present

## 2021-12-29 DIAGNOSIS — Z7689 Persons encountering health services in other specified circumstances: Secondary | ICD-10-CM | POA: Diagnosis not present

## 2021-12-29 DIAGNOSIS — K921 Melena: Secondary | ICD-10-CM | POA: Diagnosis not present

## 2021-12-29 DIAGNOSIS — G301 Alzheimer's disease with late onset: Secondary | ICD-10-CM | POA: Diagnosis not present

## 2021-12-29 DIAGNOSIS — G9341 Metabolic encephalopathy: Secondary | ICD-10-CM | POA: Diagnosis not present

## 2021-12-29 DIAGNOSIS — N182 Chronic kidney disease, stage 2 (mild): Secondary | ICD-10-CM | POA: Diagnosis not present

## 2021-12-29 DIAGNOSIS — J9601 Acute respiratory failure with hypoxia: Secondary | ICD-10-CM | POA: Diagnosis not present

## 2022-01-01 DIAGNOSIS — U099 Post covid-19 condition, unspecified: Secondary | ICD-10-CM | POA: Diagnosis not present

## 2022-01-01 DIAGNOSIS — N390 Urinary tract infection, site not specified: Secondary | ICD-10-CM | POA: Diagnosis not present

## 2022-01-01 DIAGNOSIS — I10 Essential (primary) hypertension: Secondary | ICD-10-CM | POA: Diagnosis not present

## 2022-01-01 DIAGNOSIS — J9601 Acute respiratory failure with hypoxia: Secondary | ICD-10-CM | POA: Diagnosis not present

## 2022-01-01 DIAGNOSIS — G301 Alzheimer's disease with late onset: Secondary | ICD-10-CM | POA: Diagnosis not present

## 2022-01-04 DIAGNOSIS — N182 Chronic kidney disease, stage 2 (mild): Secondary | ICD-10-CM | POA: Diagnosis not present

## 2022-01-04 DIAGNOSIS — R739 Hyperglycemia, unspecified: Secondary | ICD-10-CM | POA: Diagnosis not present

## 2022-01-04 DIAGNOSIS — Z7689 Persons encountering health services in other specified circumstances: Secondary | ICD-10-CM | POA: Diagnosis not present

## 2022-01-04 DIAGNOSIS — D649 Anemia, unspecified: Secondary | ICD-10-CM | POA: Diagnosis not present

## 2022-01-04 DIAGNOSIS — D72829 Elevated white blood cell count, unspecified: Secondary | ICD-10-CM | POA: Diagnosis not present

## 2022-01-06 DIAGNOSIS — K5900 Constipation, unspecified: Secondary | ICD-10-CM | POA: Diagnosis not present

## 2022-01-06 DIAGNOSIS — D649 Anemia, unspecified: Secondary | ICD-10-CM | POA: Diagnosis not present

## 2022-01-06 DIAGNOSIS — K921 Melena: Secondary | ICD-10-CM | POA: Diagnosis not present

## 2022-01-06 DIAGNOSIS — K649 Unspecified hemorrhoids: Secondary | ICD-10-CM | POA: Diagnosis not present

## 2022-01-11 DIAGNOSIS — R319 Hematuria, unspecified: Secondary | ICD-10-CM | POA: Diagnosis not present

## 2022-01-11 DIAGNOSIS — D649 Anemia, unspecified: Secondary | ICD-10-CM | POA: Diagnosis not present

## 2022-01-25 DIAGNOSIS — G300 Alzheimer's disease with early onset: Secondary | ICD-10-CM | POA: Diagnosis not present

## 2022-01-25 DIAGNOSIS — F02B3 Dementia in other diseases classified elsewhere, moderate, with mood disturbance: Secondary | ICD-10-CM | POA: Diagnosis not present

## 2022-01-28 DIAGNOSIS — Z9181 History of falling: Secondary | ICD-10-CM | POA: Diagnosis not present

## 2022-01-28 DIAGNOSIS — W19XXXA Unspecified fall, initial encounter: Secondary | ICD-10-CM | POA: Diagnosis not present

## 2022-01-28 DIAGNOSIS — S51812A Laceration without foreign body of left forearm, initial encounter: Secondary | ICD-10-CM | POA: Diagnosis not present

## 2022-01-28 DIAGNOSIS — M6281 Muscle weakness (generalized): Secondary | ICD-10-CM | POA: Diagnosis not present

## 2022-01-28 DIAGNOSIS — R2689 Other abnormalities of gait and mobility: Secondary | ICD-10-CM | POA: Diagnosis not present

## 2022-02-05 DIAGNOSIS — K649 Unspecified hemorrhoids: Secondary | ICD-10-CM | POA: Diagnosis not present

## 2022-02-11 DIAGNOSIS — M6281 Muscle weakness (generalized): Secondary | ICD-10-CM | POA: Diagnosis not present

## 2022-02-11 DIAGNOSIS — R350 Frequency of micturition: Secondary | ICD-10-CM | POA: Diagnosis not present

## 2022-02-11 DIAGNOSIS — R5381 Other malaise: Secondary | ICD-10-CM | POA: Diagnosis not present

## 2022-02-11 DIAGNOSIS — Z8616 Personal history of COVID-19: Secondary | ICD-10-CM | POA: Diagnosis not present

## 2022-02-15 DIAGNOSIS — R2689 Other abnormalities of gait and mobility: Secondary | ICD-10-CM | POA: Diagnosis not present

## 2022-02-15 DIAGNOSIS — Z9181 History of falling: Secondary | ICD-10-CM | POA: Diagnosis not present

## 2022-02-15 DIAGNOSIS — R52 Pain, unspecified: Secondary | ICD-10-CM | POA: Diagnosis not present

## 2022-02-15 DIAGNOSIS — W19XXXA Unspecified fall, initial encounter: Secondary | ICD-10-CM | POA: Diagnosis not present

## 2022-02-15 DIAGNOSIS — M6281 Muscle weakness (generalized): Secondary | ICD-10-CM | POA: Diagnosis not present

## 2022-02-17 DIAGNOSIS — R279 Unspecified lack of coordination: Secondary | ICD-10-CM | POA: Diagnosis not present

## 2022-02-17 DIAGNOSIS — M6282 Rhabdomyolysis: Secondary | ICD-10-CM | POA: Diagnosis not present

## 2022-02-17 DIAGNOSIS — Z9181 History of falling: Secondary | ICD-10-CM | POA: Diagnosis not present

## 2022-02-18 DIAGNOSIS — M6282 Rhabdomyolysis: Secondary | ICD-10-CM | POA: Diagnosis not present

## 2022-02-18 DIAGNOSIS — R279 Unspecified lack of coordination: Secondary | ICD-10-CM | POA: Diagnosis not present

## 2022-02-18 DIAGNOSIS — Z9181 History of falling: Secondary | ICD-10-CM | POA: Diagnosis not present

## 2022-02-19 DIAGNOSIS — Z9181 History of falling: Secondary | ICD-10-CM | POA: Diagnosis not present

## 2022-02-19 DIAGNOSIS — R279 Unspecified lack of coordination: Secondary | ICD-10-CM | POA: Diagnosis not present

## 2022-02-19 DIAGNOSIS — R52 Pain, unspecified: Secondary | ICD-10-CM | POA: Diagnosis not present

## 2022-02-19 DIAGNOSIS — M6281 Muscle weakness (generalized): Secondary | ICD-10-CM | POA: Diagnosis not present

## 2022-02-19 DIAGNOSIS — W19XXXA Unspecified fall, initial encounter: Secondary | ICD-10-CM | POA: Diagnosis not present

## 2022-02-19 DIAGNOSIS — M6282 Rhabdomyolysis: Secondary | ICD-10-CM | POA: Diagnosis not present

## 2022-02-19 DIAGNOSIS — R2689 Other abnormalities of gait and mobility: Secondary | ICD-10-CM | POA: Diagnosis not present

## 2022-02-22 DIAGNOSIS — E559 Vitamin D deficiency, unspecified: Secondary | ICD-10-CM | POA: Diagnosis not present

## 2022-02-22 DIAGNOSIS — M6281 Muscle weakness (generalized): Secondary | ICD-10-CM | POA: Diagnosis not present

## 2022-02-22 DIAGNOSIS — F02B3 Dementia in other diseases classified elsewhere, moderate, with mood disturbance: Secondary | ICD-10-CM | POA: Diagnosis not present

## 2022-02-22 DIAGNOSIS — E0829 Diabetes mellitus due to underlying condition with other diabetic kidney complication: Secondary | ICD-10-CM | POA: Diagnosis not present

## 2022-02-22 DIAGNOSIS — W19XXXA Unspecified fall, initial encounter: Secondary | ICD-10-CM | POA: Diagnosis not present

## 2022-02-22 DIAGNOSIS — N1831 Chronic kidney disease, stage 3a: Secondary | ICD-10-CM | POA: Diagnosis not present

## 2022-02-22 DIAGNOSIS — Z9181 History of falling: Secondary | ICD-10-CM | POA: Diagnosis not present

## 2022-02-22 DIAGNOSIS — I10 Essential (primary) hypertension: Secondary | ICD-10-CM | POA: Diagnosis not present

## 2022-02-22 DIAGNOSIS — N390 Urinary tract infection, site not specified: Secondary | ICD-10-CM | POA: Diagnosis not present

## 2022-02-22 DIAGNOSIS — E860 Dehydration: Secondary | ICD-10-CM | POA: Diagnosis not present

## 2022-02-22 DIAGNOSIS — D649 Anemia, unspecified: Secondary | ICD-10-CM | POA: Diagnosis not present

## 2022-02-22 DIAGNOSIS — G300 Alzheimer's disease with early onset: Secondary | ICD-10-CM | POA: Diagnosis not present

## 2022-02-23 DIAGNOSIS — Z9181 History of falling: Secondary | ICD-10-CM | POA: Diagnosis not present

## 2022-02-23 DIAGNOSIS — R279 Unspecified lack of coordination: Secondary | ICD-10-CM | POA: Diagnosis not present

## 2022-02-23 DIAGNOSIS — M6282 Rhabdomyolysis: Secondary | ICD-10-CM | POA: Diagnosis not present

## 2022-02-24 ENCOUNTER — Emergency Department (HOSPITAL_COMMUNITY): Payer: Medicare Other

## 2022-02-24 ENCOUNTER — Other Ambulatory Visit: Payer: Self-pay

## 2022-02-24 ENCOUNTER — Emergency Department (HOSPITAL_COMMUNITY)
Admission: EM | Admit: 2022-02-24 | Discharge: 2022-02-25 | Disposition: A | Discharge status: Home / Self Care | Payer: Medicare Other | Attending: Emergency Medicine | Admitting: Emergency Medicine

## 2022-02-24 DIAGNOSIS — I129 Hypertensive chronic kidney disease with stage 1 through stage 4 chronic kidney disease, or unspecified chronic kidney disease: Secondary | ICD-10-CM | POA: Diagnosis not present

## 2022-02-24 DIAGNOSIS — I499 Cardiac arrhythmia, unspecified: Secondary | ICD-10-CM | POA: Diagnosis not present

## 2022-02-24 DIAGNOSIS — R6889 Other general symptoms and signs: Secondary | ICD-10-CM | POA: Diagnosis not present

## 2022-02-24 DIAGNOSIS — N189 Chronic kidney disease, unspecified: Secondary | ICD-10-CM | POA: Insufficient documentation

## 2022-02-24 DIAGNOSIS — S199XXA Unspecified injury of neck, initial encounter: Secondary | ICD-10-CM | POA: Diagnosis not present

## 2022-02-24 DIAGNOSIS — R4789 Other speech disturbances: Secondary | ICD-10-CM | POA: Diagnosis not present

## 2022-02-24 DIAGNOSIS — R29810 Facial weakness: Secondary | ICD-10-CM | POA: Diagnosis not present

## 2022-02-24 DIAGNOSIS — R4182 Altered mental status, unspecified: Secondary | ICD-10-CM | POA: Diagnosis not present

## 2022-02-24 DIAGNOSIS — F0390 Unspecified dementia without behavioral disturbance: Secondary | ICD-10-CM | POA: Insufficient documentation

## 2022-02-24 DIAGNOSIS — Z743 Need for continuous supervision: Secondary | ICD-10-CM | POA: Diagnosis not present

## 2022-02-24 DIAGNOSIS — I10 Essential (primary) hypertension: Secondary | ICD-10-CM | POA: Diagnosis not present

## 2022-02-24 DIAGNOSIS — R569 Unspecified convulsions: Secondary | ICD-10-CM | POA: Diagnosis not present

## 2022-02-24 DIAGNOSIS — Z7982 Long term (current) use of aspirin: Secondary | ICD-10-CM | POA: Diagnosis not present

## 2022-02-24 DIAGNOSIS — R410 Disorientation, unspecified: Secondary | ICD-10-CM

## 2022-02-24 LAB — COMPREHENSIVE METABOLIC PANEL
ALT: 15 U/L (ref 0–44)
AST: 19 U/L (ref 15–41)
Albumin: 3.3 g/dL — ABNORMAL LOW (ref 3.5–5.0)
Alkaline Phosphatase: 76 U/L (ref 38–126)
Anion gap: 8 (ref 5–15)
BUN: 16 mg/dL (ref 8–23)
CO2: 26 mmol/L (ref 22–32)
Calcium: 8.8 mg/dL — ABNORMAL LOW (ref 8.9–10.3)
Chloride: 105 mmol/L (ref 98–111)
Creatinine, Ser: 1.17 mg/dL (ref 0.61–1.24)
GFR, Estimated: >60 mL/min (ref >60)
Glucose, Bld: 90 mg/dL (ref 70–99)
Potassium: 3.8 mmol/L (ref 3.5–5.1)
Sodium: 139 mmol/L (ref 135–145)
Total Bilirubin: 1.2 mg/dL (ref 0.3–1.2)
Total Protein: 7.4 g/dL (ref 6.5–8.1)

## 2022-02-24 LAB — CBC WITH DIFFERENTIAL/PLATELET
Abs Immature Granulocytes: 0.01 10*3/uL (ref 0.00–0.07)
Basophils Absolute: 0.1 10*3/uL (ref 0.0–0.1)
Basophils Relative: 1 %
Eosinophils Absolute: 0.2 10*3/uL (ref 0.0–0.5)
Eosinophils Relative: 3 %
HCT: 36 % — ABNORMAL LOW (ref 39.0–52.0)
Hemoglobin: 11.2 g/dL — ABNORMAL LOW (ref 13.0–17.0)
Immature Granulocytes: 0 %
Lymphocytes Relative: 16 %
Lymphs Abs: 1.1 10*3/uL (ref 0.7–4.0)
MCH: 28.2 pg (ref 26.0–34.0)
MCHC: 31.1 g/dL (ref 30.0–36.0)
MCV: 90.7 fL (ref 80.0–100.0)
Monocytes Absolute: 0.8 10*3/uL (ref 0.1–1.0)
Monocytes Relative: 11 %
Neutro Abs: 4.9 10*3/uL (ref 1.7–7.7)
Neutrophils Relative %: 69 %
Platelets: 296 10*3/uL (ref 150–400)
RBC: 3.97 MIL/uL — ABNORMAL LOW (ref 4.22–5.81)
RDW: 14.9 % (ref 11.5–15.5)
WBC: 7 10*3/uL (ref 4.0–10.5)
nRBC: 0 % (ref 0.0–0.2)

## 2022-02-24 LAB — CK: Total CK: 127 U/L (ref 49–397)

## 2022-02-24 LAB — CBG MONITORING, ED: Glucose-Capillary: 80 mg/dL (ref 70–99)

## 2022-02-24 MED ORDER — LORAZEPAM 2 MG/ML IJ SOLN
1.0000 mg | Freq: Once | INTRAMUSCULAR | Status: AC | PRN
  Administered 2022-02-24: 1 mg via INTRAVENOUS
  Filled 2022-02-24: qty 1

## 2022-02-24 NOTE — ED Provider Triage Note (Signed)
Emergency Medicine Provider Triage Evaluation Note ? ?Alexander Bradley , a 81 y.o. male  was evaluated in triage.  Pt complains of supposed seizure-like activity today.  Patient is a resident of a skilled nurse facility, was noted to be rigid and tense upon waking up this morning.  Patient has no recollection of this event.  Staff at facility noted the patient had seizure-like activity.  Patient denies any history of seizure-like activity. ? ?Review of Systems  ?Positive: Seizures ?Negative: Lightheadedness, dizziness, headache, weakness, chest pain, shortness of breath, abdominal pain, nausea, vomiting, fevers, sore throat, painful urination ? ?Physical Exam  ?BP (!) 173/86 (BP Location: Right Arm)   Pulse 73   Temp 97.7 ?F (36.5 ?C) (Oral)   Resp 18   SpO2 100%  ?Gen:   Awake, no distress   ?Resp:  Normal effort  ?MSK:   Moves extremities without difficulty  ?Other:  Patient has history of dementia.  Alert and oriented x2. ? ?Medical Decision Making  ?Medically screening exam initiated at 1:10 PM.  Appropriate orders placed.  Alexander Bradley was informed that the remainder of the evaluation will be completed by another provider, this initial triage assessment does not replace that evaluation, and the importance of remaining in the ED until their evaluation is complete. ? ? ?  ?Azucena Cecil, PA-C ?02/24/22 1311 ? ?

## 2022-02-24 NOTE — ED Provider Notes (Addendum)
?  Physical Exam  ?BP 138/88 (BP Location: Left Arm)   Pulse 77   Temp 97.7 ?F (36.5 ?C) (Oral)   Resp 19   SpO2 97%  ? ?Physical Exam ? ?Procedures  ?Procedures ? ?ED Course / MDM  ?  ?Medical Decision Making ?Care assumed at 3 PM.  Patient had a seizure versus stroke symptoms.  Apparently everything resolved arrival.  Patient was recently admitted for stroke work-up at [facility] and MRI was negative.  Patient is not currently on any seizure medicine.  Signout pending neurology consult ? ?5 pm ?I talked to Dr. Olga Millers.  He states that since patient is not on seizure meds and this is first-time seizure, patient does not need to be started on antiepileptic.  Per the transfer paperwork, patient apparently had a right facial droop at that time and slurred speech.  Patient is back to his baseline.  I discussed case with the son.  We will order MRI brain ? ?11:17 PM ?Patient received Ativan before MRI.  However now he is very sedated but still unable to tolerate MRI.  I discussed case with son at bedside.  He states that he wants patient to go back to the facility and not get admitted for MRI under sedation. Given his dementia, getting MRI with sedation may be more harm at this point.  Stable for discharge ? ?Problems Addressed: ?Altered mental status, unspecified altered mental status type: acute illness or injury ?Confusion: acute illness or injury ? ?Amount and/or Complexity of Data Reviewed ?Labs: ordered. Decision-making details documented in ED Course. ?Radiology: ordered and independent interpretation performed. Decision-making details documented in ED Course. ?ECG/medicine tests: ordered and independent interpretation performed. Decision-making details documented in ED Course. ? ?Risk ?Prescription drug management. ? ? ? ? ? ? ? ?  ?Drenda Freeze, MD ?02/24/22 2319 ? ?  ?Drenda Freeze, MD ?02/24/22 2341 ? ?

## 2022-02-24 NOTE — ED Triage Notes (Signed)
Pt here via [facility] from [facility] for possible seizure at 0600 today. Per staff they found him tense and rigid in his bed w/ grimacing face. At first pt refused transport to hospital, SNF called ems back to transport, pt agreed to be seen. Pt has no complaints, states he doesn't remember what happened this morning. Aox3, hx of dementia, 20g LAC ?

## 2022-02-24 NOTE — ED Notes (Signed)
Ikenna Ohms son [phone] requesting an update on the patient  ?

## 2022-02-24 NOTE — ED Notes (Signed)
Patient returned from MRI.

## 2022-02-24 NOTE — ED Notes (Signed)
RN found pt holding IV in his hand, RN placed another 20g in [place]. ?

## 2022-02-24 NOTE — ED Notes (Signed)
Patient transported to MRI 

## 2022-02-24 NOTE — ED Notes (Signed)
PTAR called for patient to returned to [facility]. ?

## 2022-02-24 NOTE — ED Notes (Addendum)
Pt reports he is here for eye problems and then said he was told he had a seizure, no hx of seizures noted in facility paperwork ?

## 2022-02-24 NOTE — ED Provider Notes (Signed)
[facility] EMERGENCY DEPARTMENT Provider Note   CSN: [id number] Arrival date & time: 02/24/22  1236     History  Chief Complaint  Patient presents with   Seizures    Alexander Bradley is a 81 y.o. male.   Seizures Patient presents from skilled nursing facility for witnessed seizure-like activity.  This occurred at approximately 6 AM.  Patient was noted to be rigid and tense upon awakening this morning.  He is amnestic to the event but also has dementia at baseline.  He has been seen by neurology for his dementia.  No history of seizure disorder was noted during previous neurology visit.  Patient denies any current pain or discomfort at this time.    Home Medications Prior to Admission medications   Medication Sig Start Date End Date Taking? Authorizing Provider  aspirin EC 81 MG tablet Take 81 mg by mouth daily.    [provider]  furosemide (LASIX) 40 MG tablet Take 40 mg by mouth every morning. 08/23/18   [provider]  memantine (NAMENDA) 10 MG tablet Take 0.5 tablet at bedtime for 7d, then 0.5 tablet twice daily for 7 days, then 1 tablet twice daily 10/04/18   Pieter Partridge, DO  olmesartan (BENICAR) 20 MG tablet Take 20 mg by mouth daily. 06/03/16   [provider]  rivaroxaban (XARELTO) 20 MG TABS tablet Take 20 mg by mouth as directed.    [provider]  sertraline (ZOLOFT) 50 MG tablet Take 50 mg by mouth daily. 08/24/18   [provider]      Allergies    Patient has no known allergies.    Review of Systems   Review of Systems  Unable to perform ROS: Dementia   Physical Exam Updated Vital Signs BP 135/70    Pulse 90    Temp 98 F (36.7 C) (Oral)    Resp 20    SpO2 98%  Physical Exam Vitals and nursing note reviewed.  Constitutional:      General: He is not in acute distress.    Appearance: Normal appearance. He is well-developed. He is not ill-appearing, toxic-appearing or diaphoretic.  HENT:      Head: Normocephalic and atraumatic.     Right Ear: External ear normal.     Left Ear: External ear normal.     Nose: Nose normal.     Mouth/Throat:     Mouth: Mucous membranes are moist.     Pharynx: Oropharynx is clear.     Comments: No tongue bite Eyes:     Conjunctiva/sclera: Conjunctivae normal.  Cardiovascular:     Rate and Rhythm: Normal rate and regular rhythm.     Heart sounds: No murmur heard. Pulmonary:     Effort: Pulmonary effort is normal. No respiratory distress.     Breath sounds: Normal breath sounds. No wheezing or rales.  Abdominal:     Palpations: Abdomen is soft.     Tenderness: There is no abdominal tenderness.  Musculoskeletal:        General: No swelling. Normal range of motion.     Cervical back: Normal range of motion and neck supple.     Right lower leg: No edema.     Left lower leg: No edema.  Skin:    General: Skin is warm and dry.     Capillary Refill: Capillary refill takes less than 2 seconds.     Coloration: Skin is not jaundiced or pale.  Neurological:  Mental Status: He is alert.     Cranial Nerves: Cranial nerves 2-12 are intact. No cranial nerve deficit, dysarthria or facial asymmetry.     Sensory: Sensation is intact. No sensory deficit.     Motor: Motor function is intact. No weakness, abnormal muscle tone or pronator drift.     Coordination: Finger-Nose-Finger Test abnormal.  Psychiatric:        Mood and Affect: Mood normal.    ED Results / Procedures / Treatments   Labs (all labs ordered are listed, but only abnormal results are displayed) Labs Reviewed  CBC WITH DIFFERENTIAL/PLATELET - Abnormal; Notable for the following components:      Result Value   RBC 3.97 (*)    Hemoglobin 11.2 (*)    HCT 36.0 (*)    All other components within normal limits  COMPREHENSIVE METABOLIC PANEL - Abnormal; Notable for the following components:   Calcium 8.8 (*)    Albumin 3.3 (*)    All other components within normal limits  CK  CBG  MONITORING, ED    EKG EKG Interpretation  Date/Time:  Wednesday February 24 2022 13:24:36 EST Ventricular Rate:  74 PR Interval:  186 QRS Duration: 124 QT Interval:  448 QTC Calculation: 497 R Axis:   211 Text Interpretation: Normal sinus rhythm Right bundle branch block Inferior infarct , age undetermined Abnormal ECG No previous ECGs available Confirmed by Godfrey Pick [phone]) on 02/24/2022 2:30:57 PM  Radiology CT Head Wo Contrast  Result Date: 02/24/2022 CLINICAL DATA:  Seizure, new-onset, no history of trauma EXAM: CT HEAD WITHOUT CONTRAST TECHNIQUE: Contiguous axial images were obtained from the base of the skull through the vertex without intravenous contrast. RADIATION DOSE REDUCTION: This exam was performed according to the departmental dose-optimization program which includes automated exposure control, adjustment of the mA and/or kV according to patient size and/or use of iterative reconstruction technique. COMPARISON:  12/09/2021 FINDINGS: Brain: There is no acute intracranial hemorrhage, mass effect, or edema. Gray-white differentiation is preserved. There is no extra-axial fluid collection. Ventricles and sulci are stable in size and configuration. Patchy hypoattenuation in the supratentorial white matter is nonspecific but may reflect mild chronic microvascular ischemic changes. Vascular: There is atherosclerotic calcification at the skull base. Skull: Calvarium is unremarkable. Sinuses/Orbits: No acute finding. Other: None. IMPRESSION: No acute intracranial abnormality or significant change since prior study. Electronically Signed   By: Macy Mis M.D.   On: 02/24/2022 14:54   CT Cervical Spine Wo Contrast  Result Date: 02/24/2022 CLINICAL DATA:  Neck trauma (Age >= 65y) EXAM: CT CERVICAL SPINE WITHOUT CONTRAST TECHNIQUE: Multidetector CT imaging of the cervical spine was performed without intravenous contrast. Multiplanar CT image reconstructions were also generated. RADIATION DOSE  REDUCTION: This exam was performed according to the departmental dose-optimization program which includes automated exposure control, adjustment of the mA and/or kV according to patient size and/or use of iterative reconstruction technique. COMPARISON:  None. FINDINGS: Alignment: Minor degenerative listhesis. Skull base and vertebrae: Vertebral body heights are maintained. No acute fracture. Soft tissues and spinal canal: No prevertebral fluid or swelling. No visible canal hematoma. Disc levels: Facet predominant degenerative changes are present, right greater than left. There is also degenerative disc disease and uncovertebral hypertrophy. Upper chest: No apical lung mass. Other: None. IMPRESSION: No acute cervical spine fracture. Electronically Signed   By: Macy Mis M.D.   On: 02/24/2022 15:11    Procedures Procedures    Medications Ordered in ED Medications  LORazepam (ATIVAN) injection 1  mg (1 mg Intravenous Given 02/24/22 2224)    ED Course/ Medical Decision Making/ A&P                           Medical Decision Making Risk Prescription drug management.   This patient presents to the ED for concern of seizure-like activity, this involves an extensive number of treatment options, and is a complaint that carries with it a high risk of complications and morbidity.  The differential diagnosis includes new seizure disorder, CVA, neoplasm, trauma, infection, metabolic disturbance.   Co morbidities that complicate the patient evaluation  HTN, HLD, CKD, dementia, chronic pain   Additional history obtained:  Additional history obtained from N/A External records from outside source obtained and reviewed including EMR   Lab Tests:  I Ordered, and personally interpreted labs.  The pertinent results include:  Normal glucose, normal electrolytes, no leukocytosis, mild anemia without comparison of prior labs.   Imaging Studies ordered:  I ordered imaging studies including CTH  I  independently visualized and interpreted imaging which showed no acute findings I agree with the radiologist interpretation   Cardiac Monitoring:  The patient was maintained on a cardiac monitor.  I personally viewed and interpreted the cardiac monitored which showed an underlying rhythm of: sinus rhythm   Consultations Obtained:  I requested consultation with the neurologist,  and discussed lab and imaging findings as well as pertinent plan - they recommend: (callback pending at time of signout)   Problem List / ED Course:  81 year old male presenting for suspected seizure-like activity this morning.  Unfortunately, initial history is very limited.  Per triage note, patient has a history of dementia and lives in skilled nursing facility.  At 6 AM he was witnessed having rigidity and grimacing face. He reportedly refused transport to hospital but then agreed to it this afternoon. Patient has no recollection of events PTA and is unable to contribute to history. He initially denies any complaints. On exam, he had difficulty with finger to nose. Acuity of this is unknown. Initial workup was initiated to include labs and noncontrasted CTH. 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 Last visit was in 2019. No mention of seizure disorder was made at that time. Initial workup results were unremarkable. Neurology was consulted but callback was still pending at time of signout. Care of patient was signed out to oncoming ED provider.     Social Determinants of Health:  Dementia. Requires skilled nursing care.           Final Clinical Impression(s) / ED Diagnoses Final diagnoses:  Altered mental status, unspecified altered mental status type  Confusion    Rx / DC Orders ED Discharge Orders     None         Godfrey Pick, MD 02/25/22 [phone]

## 2022-02-24 NOTE — Discharge Instructions (Addendum)
Your labs and CT scans were unremarkable today. ? ?I do not see any major stroke on your CT scan ? ?See your doctor for follow-up ? ?You may want to see a neurologist. ? ?Return to ER if you have worse confusion, seizure, trouble speaking, any strokelike symptoms. ?

## 2022-02-25 DIAGNOSIS — R569 Unspecified convulsions: Secondary | ICD-10-CM | POA: Diagnosis not present

## 2022-02-25 DIAGNOSIS — R5383 Other fatigue: Secondary | ICD-10-CM | POA: Diagnosis not present

## 2022-02-25 DIAGNOSIS — R279 Unspecified lack of coordination: Secondary | ICD-10-CM | POA: Diagnosis not present

## 2022-02-25 DIAGNOSIS — Z743 Need for continuous supervision: Secondary | ICD-10-CM | POA: Diagnosis not present

## 2022-02-25 DIAGNOSIS — M6282 Rhabdomyolysis: Secondary | ICD-10-CM | POA: Diagnosis not present

## 2022-02-25 NOTE — ED Notes (Signed)
Pt resting on stretcher with eyes closed, respirations even and unlabored. Pt waiting for PTAR to arrive. No acute changes noted. Will continue to monitor.  ?

## 2022-02-26 DIAGNOSIS — R279 Unspecified lack of coordination: Secondary | ICD-10-CM | POA: Diagnosis not present

## 2022-02-26 DIAGNOSIS — M6282 Rhabdomyolysis: Secondary | ICD-10-CM | POA: Diagnosis not present

## 2022-02-26 DIAGNOSIS — Z972 Presence of dental prosthetic device (complete) (partial): Secondary | ICD-10-CM | POA: Diagnosis not present

## 2022-03-01 DIAGNOSIS — W19XXXA Unspecified fall, initial encounter: Secondary | ICD-10-CM | POA: Diagnosis not present

## 2022-03-01 DIAGNOSIS — Z9181 History of falling: Secondary | ICD-10-CM | POA: Diagnosis not present

## 2022-03-01 DIAGNOSIS — S61512A Laceration without foreign body of left wrist, initial encounter: Secondary | ICD-10-CM | POA: Diagnosis not present

## 2022-03-01 DIAGNOSIS — M6281 Muscle weakness (generalized): Secondary | ICD-10-CM | POA: Diagnosis not present

## 2022-03-01 DIAGNOSIS — R2689 Other abnormalities of gait and mobility: Secondary | ICD-10-CM | POA: Diagnosis not present

## 2022-03-02 DIAGNOSIS — R279 Unspecified lack of coordination: Secondary | ICD-10-CM | POA: Diagnosis not present

## 2022-03-02 DIAGNOSIS — M6282 Rhabdomyolysis: Secondary | ICD-10-CM | POA: Diagnosis not present

## 2022-03-04 DIAGNOSIS — M6282 Rhabdomyolysis: Secondary | ICD-10-CM | POA: Diagnosis not present

## 2022-03-04 DIAGNOSIS — R279 Unspecified lack of coordination: Secondary | ICD-10-CM | POA: Diagnosis not present

## 2022-03-05 DIAGNOSIS — M6282 Rhabdomyolysis: Secondary | ICD-10-CM | POA: Diagnosis not present

## 2022-03-05 DIAGNOSIS — R279 Unspecified lack of coordination: Secondary | ICD-10-CM | POA: Diagnosis not present

## 2022-03-09 DIAGNOSIS — M6282 Rhabdomyolysis: Secondary | ICD-10-CM | POA: Diagnosis not present

## 2022-03-09 DIAGNOSIS — R279 Unspecified lack of coordination: Secondary | ICD-10-CM | POA: Diagnosis not present

## 2022-03-12 DIAGNOSIS — K649 Unspecified hemorrhoids: Secondary | ICD-10-CM | POA: Diagnosis not present

## 2022-03-12 DIAGNOSIS — K5900 Constipation, unspecified: Secondary | ICD-10-CM | POA: Diagnosis not present

## 2022-03-15 DIAGNOSIS — M6282 Rhabdomyolysis: Secondary | ICD-10-CM | POA: Diagnosis not present

## 2022-03-15 DIAGNOSIS — R279 Unspecified lack of coordination: Secondary | ICD-10-CM | POA: Diagnosis not present

## 2022-03-17 DIAGNOSIS — M6282 Rhabdomyolysis: Secondary | ICD-10-CM | POA: Diagnosis not present

## 2022-03-17 DIAGNOSIS — R279 Unspecified lack of coordination: Secondary | ICD-10-CM | POA: Diagnosis not present

## 2022-03-19 ENCOUNTER — Other Ambulatory Visit: Payer: Self-pay

## 2022-03-19 ENCOUNTER — Emergency Department (HOSPITAL_COMMUNITY): Payer: Medicare Other

## 2022-03-19 ENCOUNTER — Emergency Department (HOSPITAL_COMMUNITY)
Admission: EM | Admit: 2022-03-19 | Discharge: 2022-03-19 | Disposition: A | Discharge status: Skilled Nursing Facility | Payer: Medicare Other | Attending: Emergency Medicine | Admitting: Emergency Medicine

## 2022-03-19 DIAGNOSIS — R531 Weakness: Secondary | ICD-10-CM | POA: Diagnosis not present

## 2022-03-19 DIAGNOSIS — W19XXXA Unspecified fall, initial encounter: Secondary | ICD-10-CM | POA: Diagnosis not present

## 2022-03-19 DIAGNOSIS — Z7901 Long term (current) use of anticoagulants: Secondary | ICD-10-CM | POA: Diagnosis not present

## 2022-03-19 DIAGNOSIS — F0390 Unspecified dementia without behavioral disturbance: Secondary | ICD-10-CM | POA: Diagnosis not present

## 2022-03-19 DIAGNOSIS — S0081XA Abrasion of other part of head, initial encounter: Secondary | ICD-10-CM | POA: Diagnosis not present

## 2022-03-19 DIAGNOSIS — Z043 Encounter for examination and observation following other accident: Secondary | ICD-10-CM | POA: Diagnosis not present

## 2022-03-19 DIAGNOSIS — Z7401 Bed confinement status: Secondary | ICD-10-CM | POA: Diagnosis not present

## 2022-03-19 DIAGNOSIS — Z7982 Long term (current) use of aspirin: Secondary | ICD-10-CM | POA: Diagnosis not present

## 2022-03-19 DIAGNOSIS — R6889 Other general symptoms and signs: Secondary | ICD-10-CM | POA: Diagnosis not present

## 2022-03-19 DIAGNOSIS — S0990XA Unspecified injury of head, initial encounter: Secondary | ICD-10-CM

## 2022-03-19 DIAGNOSIS — Y9301 Activity, walking, marching and hiking: Secondary | ICD-10-CM | POA: Diagnosis not present

## 2022-03-19 DIAGNOSIS — Z743 Need for continuous supervision: Secondary | ICD-10-CM | POA: Diagnosis not present

## 2022-03-19 DIAGNOSIS — S199XXA Unspecified injury of neck, initial encounter: Secondary | ICD-10-CM | POA: Diagnosis not present

## 2022-03-19 DIAGNOSIS — W01198A Fall on same level from slipping, tripping and stumbling with subsequent striking against other object, initial encounter: Secondary | ICD-10-CM | POA: Diagnosis not present

## 2022-03-19 MED ORDER — ACETAMINOPHEN 500 MG PO TABS
1000.0000 mg | ORAL_TABLET | Freq: Once | ORAL | Status: AC
  Administered 2022-03-19: 1000 mg via ORAL
  Filled 2022-03-19: qty 2

## 2022-03-19 NOTE — ED Triage Notes (Signed)
Pt from [facility], tripped over some cords this morning and had mechanical fall. Skin tear to R hand and hematoma for forehead. No LOC. Pt on Xalreto for hx of clots. Hx dementia, GCS 15.  ?

## 2022-03-19 NOTE — Discharge Instructions (Signed)
Please follow-up with your primary care doctor.  Please return to the ER for any new or concerning symptoms.  You may take Tylenol 1000 mg every 6 hours as needed for pain. ?

## 2022-03-19 NOTE — ED Notes (Signed)
Provided pt urinal at the bedside. 

## 2022-03-19 NOTE — ED Provider Notes (Signed)
?[facility] ?Provider Note ? ? ?CSN: [id number] ?Arrival date & time: 03/19/22  1033 ? ?  ? ?History ? ?Chief Complaint  ?Patient presents with  ? Fall  ? ? ?Alexander Bradley is a 81 y.o. male. ? ? ?Fall ?Patient is an 81 year old male with some mild baseline dementia presents emergency room today with complaints of mechanical fall he states that he was walking forwards when he tripped over a cord he tripped forward and struck his head on the right side of his forehead against the ground.  He states he did not suffer a loss of consciousness and has not had any nausea or vomiting.  He states he feels probably well currently has no pain anywhere denies any neck pain chest pain or extremity pain.  No chest or abdominal pain. ? ?Denies any bleeding from his forehead ? ? ?Home Medications ?Prior to Admission medications   ?Medication Sig Start Date End Date Taking? Authorizing Provider  ?aspirin 81 MG chewable tablet Chew 81 mg by mouth daily.   Yes [provider]  ?Calcium Carbonate-Vit D-Min (CALCIUM 600+D PLUS MINERALS) 600-400 MG-UNIT TABS Take 1 tablet by mouth daily.   Yes [provider]  ?cyclobenzaprine (FLEXERIL) 5 MG tablet Take 5 mg by mouth 3 (three) times daily as needed for muscle spasms.   Yes [provider]  ?Docosanol 10 % CREA Apply 1 application. topically 5 (five) times daily.   Yes [provider]  ?furosemide (LASIX) 40 MG tablet Take 40 mg by mouth daily.   Yes [provider]  ?hydrocortisone cream 1 % Apply 1 application. topically daily.   Yes [provider]  ?olmesartan (BENICAR) 20 MG tablet Take 20 mg by mouth daily. 06/03/16  Yes [provider]  ?pantoprazole (PROTONIX) 40 MG tablet Take 40 mg by mouth daily. 01/28/22  Yes [provider]  ?rivaroxaban (XARELTO) 20 MG TABS tablet Take 20 mg by mouth as directed.   Yes [provider]  ?senna (SENOKOT) 8.6 MG TABS  tablet Take 1 tablet by mouth daily.   Yes [provider]  ?sertraline (ZOLOFT) 50 MG tablet Take 75 mg by mouth daily. 08/24/18  Yes [provider]  ?memantine (NAMENDA) 10 MG tablet Take 0.5 tablet at bedtime for 7d, then 0.5 tablet twice daily for 7 days, then 1 tablet twice daily ?Patient not taking: Reported on 03/19/2022 10/04/18   Pieter Partridge, DO  ?   ? ?Allergies    ?Patient has no known allergies.   ? ?Review of Systems   ?Review of Systems ? ?Physical Exam ?Updated Vital Signs ?BP (!) 162/75   Pulse 62   Temp 97.8 ?F (36.6 ?C)   Resp 18   Ht '5\' 10"'$  (1.778 m)   Wt 86.2 kg   SpO2 100%   BMI 27.26 kg/m?  ?Physical Exam ?Vitals and nursing note reviewed.  ?Constitutional:   ?   General: He is not in acute distress. ?HENT:  ?   Head: Normocephalic.  ?   Comments: Abrasion to right forehead ?   Nose: Nose normal.  ?Eyes:  ?   General: No scleral icterus. ?Cardiovascular:  ?   Rate and Rhythm: Normal rate and regular rhythm.  ?   Pulses: Normal pulses.  ?   Heart sounds: Normal heart sounds.  ?Pulmonary:  ?   Effort: Pulmonary effort is normal. No respiratory distress.  ?   Breath sounds: No wheezing.  ?Abdominal:  ?  Palpations: Abdomen is soft.  ?   Tenderness: There is no abdominal tenderness.  ?Musculoskeletal:  ?   Cervical back: Normal range of motion.  ?   Right lower leg: No edema.  ?   Left lower leg: No edema.  ?   Comments: No bony tenderness over joints or long bones of the upper and lower extremities.   ? ?No neck or back midline tenderness, step-off, deformity, or bruising.  Currently on cervical collar unable to palpate C-spine ? ?Full range of motion of upper and lower extremity joints shown after palpation was conducted; with 5/5 symmetrical strength in upper and lower extremities. No chest wall tenderness, no facial or cranial tenderness.  ? ?Patient has intact sensation grossly in lower and upper extremities. Intact patellar and ankle reflexes. Patient able to  ambulate without difficulty.  ?Radial and DP pulses palpated BL.   ?Skin: ?   General: Skin is warm and dry.  ?   Capillary Refill: Capillary refill takes less than 2 seconds.  ?Neurological:  ?   Mental Status: He is alert. Mental status is at baseline.  ?Psychiatric:     ?   Mood and Affect: Mood normal.     ?   Behavior: Behavior normal.  ? ? ?ED Results / Procedures / Treatments   ?Labs ?(all labs ordered are listed, but only abnormal results are displayed) ?Labs Reviewed - No data to display ? ?EKG ?None ? ?Radiology ?CT HEAD WO CONTRAST (5MM) ? ?Result Date: 03/19/2022 ?CLINICAL DATA:  81 year old male with neck trauma, fall EXAM: CT HEAD WITHOUT CONTRAST CT CERVICAL SPINE WITHOUT CONTRAST TECHNIQUE: Multidetector CT imaging of the head and cervical spine was performed following the standard protocol without intravenous contrast. Multiplanar CT image reconstructions of the cervical spine were also generated. RADIATION DOSE REDUCTION: This exam was performed according to the departmental dose-optimization program which includes automated exposure control, adjustment of the mA and/or kV according to patient size and/or use of iterative reconstruction technique. COMPARISON:  None. FINDINGS: CT HEAD FINDINGS Brain: No acute intracranial hemorrhage. No midline shift or mass effect. Gray-white differentiation maintained. Mild volume loss. Patchy hypodensities in the bilateral periventricular white matter similar to prior. Unremarkable appearance of the ventricular system. Vascular: Intracranial atherosclerosis. Skull: Soft tissue swelling in the right frontal/supraorbital region. No radiopaque foreign body. No acute fracture. Sinuses/Orbits: Partial opacification of the sphenoid sinuses, unchanged from the prior. Other: None CT CERVICAL SPINE FINDINGS Cervical Spine: Alignment: Craniocervical junction unremarkable. Alignment of the cervical levels unchanged from the prior. No subluxation. Skull base and vertebrae: No  acute fracture at the skullbase. Vertebral body heights relatively maintained. No acute fracture identified. Soft tissues and spinal canal: Unremarkable cervical soft tissues. Lymph nodes are present, though not enlarged. Disc levels: Disc space narrowing throughout the cervical spine. Anterior osteophyte production throughout. Right greater than left facet hypertrophy with associated uncovertebral joint disease, contributes to bilateral varying degrees of foraminal narrowing worst on the right at C3-C4 and C4-C5. Upper chest: Unremarkable appearance of the lung apices. Other: No canal hematoma identified. IMPRESSION: Head CT: No acute intracranial abnormality. Mild paranasal sinus disease unchanged from prior. Cervical CT: No acute fracture or malalignment. Multilevel degenerative changes Electronically Signed   By: Corrie Mckusick D.O.   On: 03/19/2022 11:28  ? ?CT Cervical Spine Wo Contrast ? ?Result Date: 03/19/2022 ?CLINICAL DATA:  81 year old male with neck trauma, fall EXAM: CT HEAD WITHOUT CONTRAST CT CERVICAL SPINE WITHOUT CONTRAST TECHNIQUE: Multidetector CT imaging of the head  and cervical spine was performed following the standard protocol without intravenous contrast. Multiplanar CT image reconstructions of the cervical spine were also generated. RADIATION DOSE REDUCTION: This exam was performed according to the departmental dose-optimization program which includes automated exposure control, adjustment of the mA and/or kV according to patient size and/or use of iterative reconstruction technique. COMPARISON:  None. FINDINGS: CT HEAD FINDINGS Brain: No acute intracranial hemorrhage. No midline shift or mass effect. Gray-white differentiation maintained. Mild volume loss. Patchy hypodensities in the bilateral periventricular white matter similar to prior. Unremarkable appearance of the ventricular system. Vascular: Intracranial atherosclerosis. Skull: Soft tissue swelling in the right frontal/supraorbital  region. No radiopaque foreign body. No acute fracture. Sinuses/Orbits: Partial opacification of the sphenoid sinuses, unchanged from the prior. Other: None CT CERVICAL SPINE FINDINGS Cervical Spine: Alignment: Cranioce

## 2022-03-19 NOTE — ED Notes (Signed)
Trauma Response Nurse Documentation ? ? ?Alexander Bradley is a 81 y.o. male arriving to [facility] ED via EMS ? ?On Xarelto (rivaroxaban) daily. Trauma was activated as a Level 2 based on the following trauma criteria >65 fall on a blood thinner. Trauma team at the bedside on patient arrival. Patient cleared for CT by Dr. Billy Fischer. Patient to CT with team. GCS 15. ? ?History  ? No past medical history on file.  ? No past surgical history on file.  ? ?Initial Focused Assessment (If applicable, or please see trauma documentation): ?[name]&Ox4, GCS 15 ?From [facility], has some baseline dementia ?Per report patient tripped over cords, fell and hit the front of his head ?Small bruise/abrasion to right side of forehead. Several older abrasions to head. ?Complaining of no pain, no other traumatic injury identified ? ?CT's Completed:   ?CT Head and CT C-Spine  ? ?Interventions:  ?CT Head/C-spine ? ?Plan for disposition:  ?Discharge home  ? ?Bedside handoff with ED RN Larene Beach.   ? ?Park Pope Keshaun Dubey  ?Trauma Response RN ? ?Please call TRN at [phone] for further assistance. ?  ?

## 2022-03-19 NOTE — ED Notes (Signed)
Patient transported to CT 

## 2022-03-19 NOTE — ED Notes (Signed)
Pt was dressed and brief dry when checked before dressing.  ?

## 2022-03-19 NOTE — ED Notes (Signed)
Alvester Chou son called for an update on pt care. Alvester Chou was concerned about pt having multiple falls at his facility recently. Alvester Chou stated he plans on speaking with coordinator at [facility] to see what interventions can be started to decrease the risk of pt falling. ? ? ?Alvester Chou would like to be notified once [facility] has picked up pt.  ?

## 2022-03-22 DIAGNOSIS — R52 Pain, unspecified: Secondary | ICD-10-CM | POA: Diagnosis not present

## 2022-03-22 DIAGNOSIS — F02B3 Dementia in other diseases classified elsewhere, moderate, with mood disturbance: Secondary | ICD-10-CM | POA: Diagnosis not present

## 2022-03-22 DIAGNOSIS — W19XXXA Unspecified fall, initial encounter: Secondary | ICD-10-CM | POA: Diagnosis not present

## 2022-03-22 DIAGNOSIS — I10 Essential (primary) hypertension: Secondary | ICD-10-CM | POA: Diagnosis not present

## 2022-03-22 DIAGNOSIS — Z9181 History of falling: Secondary | ICD-10-CM | POA: Diagnosis not present

## 2022-03-22 DIAGNOSIS — S0083XA Contusion of other part of head, initial encounter: Secondary | ICD-10-CM | POA: Diagnosis not present

## 2022-03-22 DIAGNOSIS — G300 Alzheimer's disease with early onset: Secondary | ICD-10-CM | POA: Diagnosis not present

## 2022-03-22 DIAGNOSIS — M6281 Muscle weakness (generalized): Secondary | ICD-10-CM | POA: Diagnosis not present

## 2022-03-22 DIAGNOSIS — R2689 Other abnormalities of gait and mobility: Secondary | ICD-10-CM | POA: Diagnosis not present

## 2022-03-22 DIAGNOSIS — S61210A Laceration without foreign body of right index finger without damage to nail, initial encounter: Secondary | ICD-10-CM | POA: Diagnosis not present

## 2022-03-24 DIAGNOSIS — E785 Hyperlipidemia, unspecified: Secondary | ICD-10-CM | POA: Diagnosis not present

## 2022-03-24 DIAGNOSIS — Z7901 Long term (current) use of anticoagulants: Secondary | ICD-10-CM | POA: Diagnosis not present

## 2022-03-24 DIAGNOSIS — I10 Essential (primary) hypertension: Secondary | ICD-10-CM | POA: Diagnosis not present

## 2022-03-24 DIAGNOSIS — G309 Alzheimer's disease, unspecified: Secondary | ICD-10-CM | POA: Diagnosis not present

## 2022-04-01 DIAGNOSIS — Z7689 Persons encountering health services in other specified circumstances: Secondary | ICD-10-CM | POA: Diagnosis not present

## 2022-04-01 DIAGNOSIS — Z87440 Personal history of urinary (tract) infections: Secondary | ICD-10-CM | POA: Diagnosis not present

## 2022-04-22 DIAGNOSIS — I10 Essential (primary) hypertension: Secondary | ICD-10-CM | POA: Diagnosis not present

## 2022-04-30 DIAGNOSIS — Z87440 Personal history of urinary (tract) infections: Secondary | ICD-10-CM | POA: Diagnosis not present

## 2022-04-30 DIAGNOSIS — R5383 Other fatigue: Secondary | ICD-10-CM | POA: Diagnosis not present

## 2022-05-03 DIAGNOSIS — R2689 Other abnormalities of gait and mobility: Secondary | ICD-10-CM | POA: Diagnosis not present

## 2022-05-03 DIAGNOSIS — F02B3 Dementia in other diseases classified elsewhere, moderate, with mood disturbance: Secondary | ICD-10-CM | POA: Diagnosis not present

## 2022-05-03 DIAGNOSIS — M6281 Muscle weakness (generalized): Secondary | ICD-10-CM | POA: Diagnosis not present

## 2022-05-03 DIAGNOSIS — I10 Essential (primary) hypertension: Secondary | ICD-10-CM | POA: Diagnosis not present

## 2022-05-03 DIAGNOSIS — G300 Alzheimer's disease with early onset: Secondary | ICD-10-CM | POA: Diagnosis not present

## 2022-05-03 DIAGNOSIS — R52 Pain, unspecified: Secondary | ICD-10-CM | POA: Diagnosis not present

## 2022-05-03 DIAGNOSIS — N390 Urinary tract infection, site not specified: Secondary | ICD-10-CM | POA: Diagnosis not present

## 2022-05-03 DIAGNOSIS — S51011A Laceration without foreign body of right elbow, initial encounter: Secondary | ICD-10-CM | POA: Diagnosis not present

## 2022-05-03 DIAGNOSIS — Z9181 History of falling: Secondary | ICD-10-CM | POA: Diagnosis not present

## 2022-05-03 DIAGNOSIS — W19XXXA Unspecified fall, initial encounter: Secondary | ICD-10-CM | POA: Diagnosis not present

## 2022-05-04 DIAGNOSIS — D649 Anemia, unspecified: Secondary | ICD-10-CM | POA: Diagnosis not present

## 2022-05-04 DIAGNOSIS — N182 Chronic kidney disease, stage 2 (mild): Secondary | ICD-10-CM | POA: Diagnosis not present

## 2022-05-04 DIAGNOSIS — Z87440 Personal history of urinary (tract) infections: Secondary | ICD-10-CM | POA: Diagnosis not present

## 2022-05-04 DIAGNOSIS — Z7689 Persons encountering health services in other specified circumstances: Secondary | ICD-10-CM | POA: Diagnosis not present

## 2022-05-06 DIAGNOSIS — R278 Other lack of coordination: Secondary | ICD-10-CM | POA: Diagnosis not present

## 2022-05-06 DIAGNOSIS — M6282 Rhabdomyolysis: Secondary | ICD-10-CM | POA: Diagnosis not present

## 2022-05-10 DIAGNOSIS — K5900 Constipation, unspecified: Secondary | ICD-10-CM | POA: Diagnosis not present

## 2022-05-11 DIAGNOSIS — R278 Other lack of coordination: Secondary | ICD-10-CM | POA: Diagnosis not present

## 2022-05-11 DIAGNOSIS — M6282 Rhabdomyolysis: Secondary | ICD-10-CM | POA: Diagnosis not present

## 2022-05-12 DIAGNOSIS — M6282 Rhabdomyolysis: Secondary | ICD-10-CM | POA: Diagnosis not present

## 2022-05-12 DIAGNOSIS — R278 Other lack of coordination: Secondary | ICD-10-CM | POA: Diagnosis not present

## 2022-05-14 DIAGNOSIS — M6282 Rhabdomyolysis: Secondary | ICD-10-CM | POA: Diagnosis not present

## 2022-05-14 DIAGNOSIS — R278 Other lack of coordination: Secondary | ICD-10-CM | POA: Diagnosis not present

## 2022-05-17 DIAGNOSIS — R278 Other lack of coordination: Secondary | ICD-10-CM | POA: Diagnosis not present

## 2022-05-17 DIAGNOSIS — M6282 Rhabdomyolysis: Secondary | ICD-10-CM | POA: Diagnosis not present

## 2022-05-20 DIAGNOSIS — R2689 Other abnormalities of gait and mobility: Secondary | ICD-10-CM | POA: Diagnosis not present

## 2022-05-20 DIAGNOSIS — S0081XA Abrasion of other part of head, initial encounter: Secondary | ICD-10-CM | POA: Diagnosis not present

## 2022-05-20 DIAGNOSIS — R52 Pain, unspecified: Secondary | ICD-10-CM | POA: Diagnosis not present

## 2022-05-20 DIAGNOSIS — M6281 Muscle weakness (generalized): Secondary | ICD-10-CM | POA: Diagnosis not present

## 2022-05-20 DIAGNOSIS — W19XXXA Unspecified fall, initial encounter: Secondary | ICD-10-CM | POA: Diagnosis not present

## 2022-05-20 DIAGNOSIS — Z9181 History of falling: Secondary | ICD-10-CM | POA: Diagnosis not present

## 2022-05-20 DIAGNOSIS — I10 Essential (primary) hypertension: Secondary | ICD-10-CM | POA: Diagnosis not present

## 2022-05-31 DIAGNOSIS — G300 Alzheimer's disease with early onset: Secondary | ICD-10-CM | POA: Diagnosis not present

## 2022-05-31 DIAGNOSIS — F02B3 Dementia in other diseases classified elsewhere, moderate, with mood disturbance: Secondary | ICD-10-CM | POA: Diagnosis not present

## 2022-06-14 IMAGING — CT CT HEAD W/O CM
4 series · 15 of 47 positions shown, 17 images · non-contrast
Comparison: None.

CLINICAL DATA: 81-year-old male with neck trauma, fall



[Series 3: head without · axial · non-contrast · 0.43mm/px · z∈[-98,+12]mm · 6 of 32 slices shown, 8 images]
[im 5/32  brain]
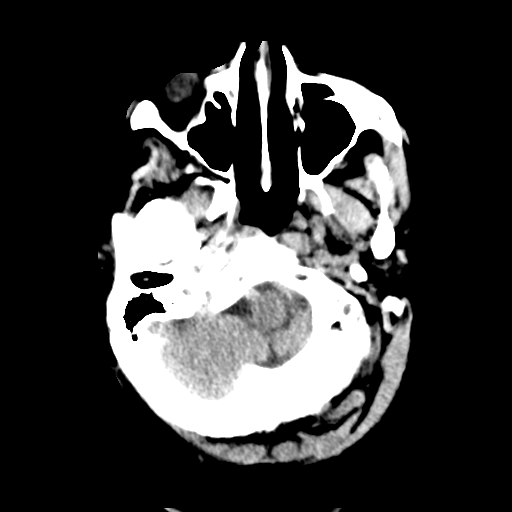
[im 5/32  bone]
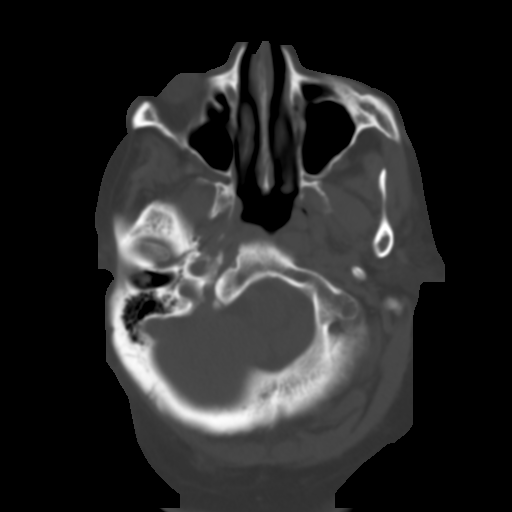
[im 9/32  brain]
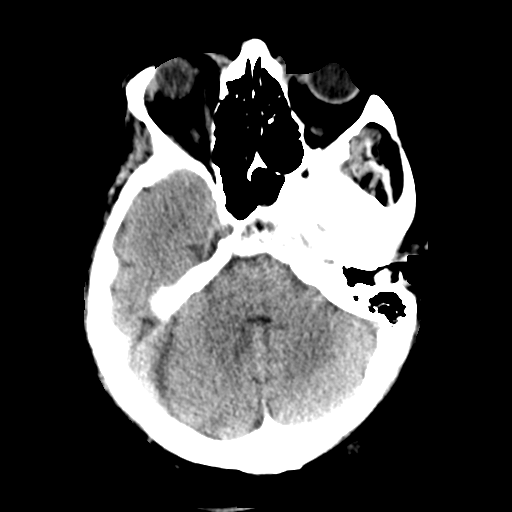
[im 14/32  brain]
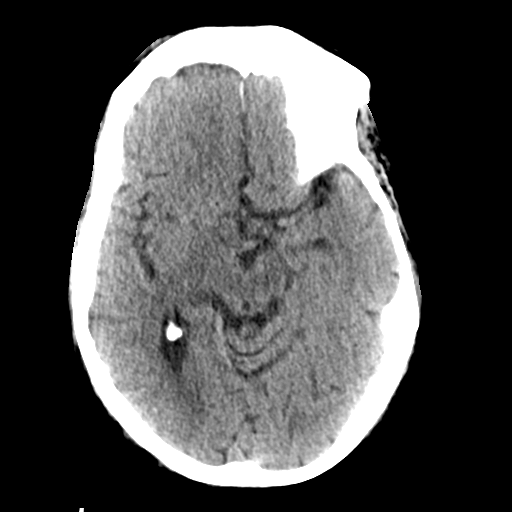
[im 18/32  brain]
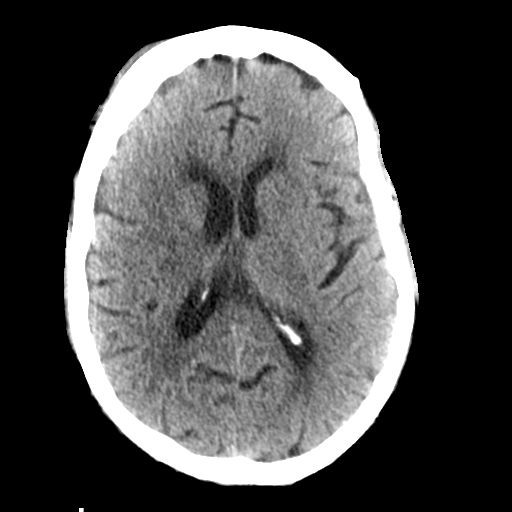
[im 23/32  brain]
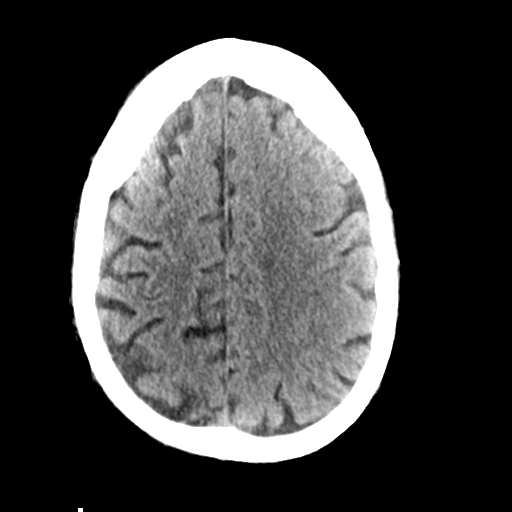
[im 23/32  bone]
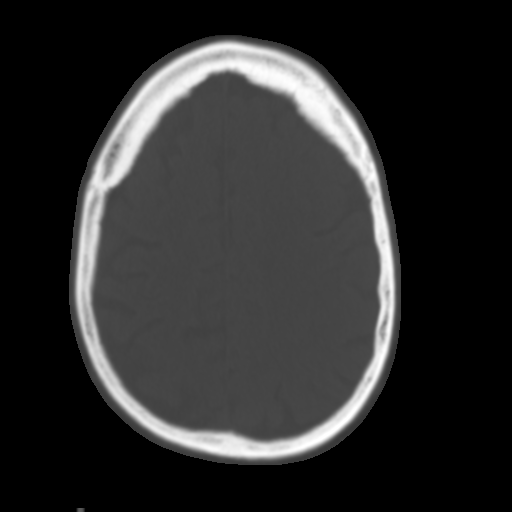
[im 27/32  brain]
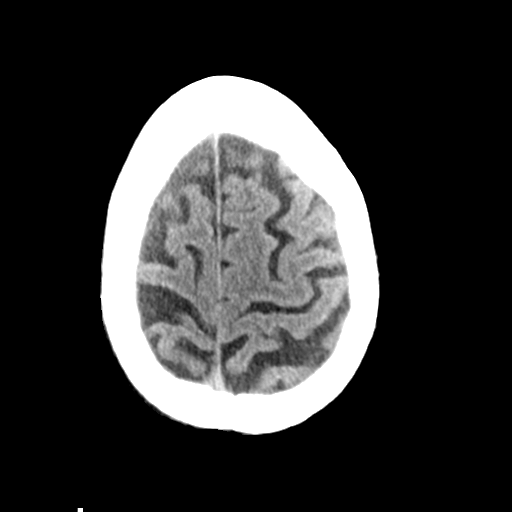

[Series 4: head bone · axial · 0.43mm/px · z∈[-104,-66]mm · 3 of 81 slices shown]
[im 8/81  bone]
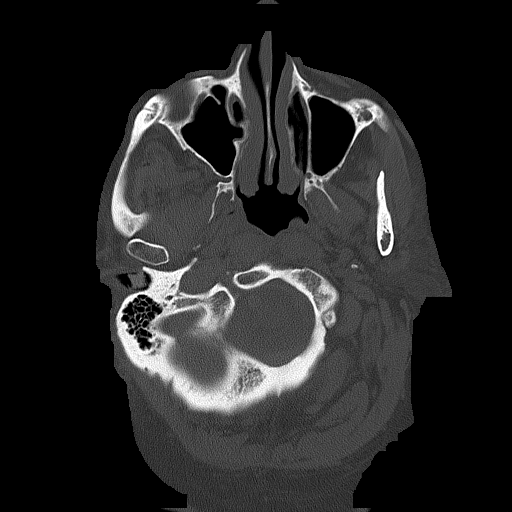
[im 16/81  bone]
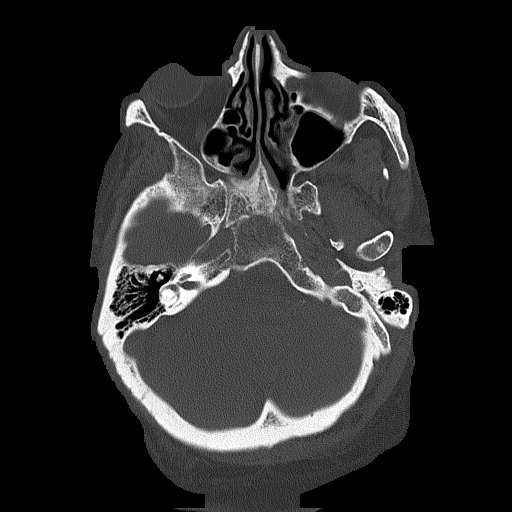
[im 27/81  bone]
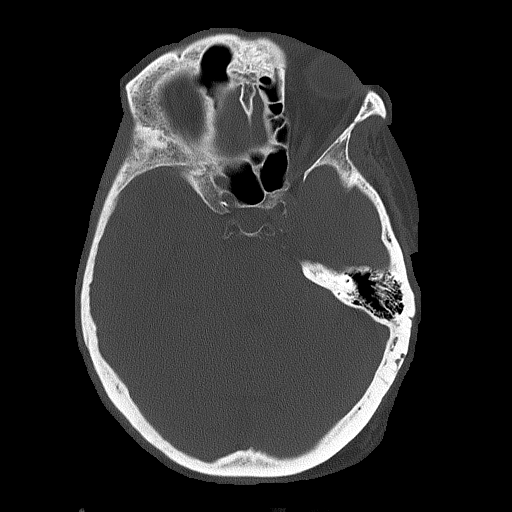

[Series 5: head without cor · coronal · non-contrast · 0.31mm/px · 3 of 70 slices shown]
[im 24/70  brain]
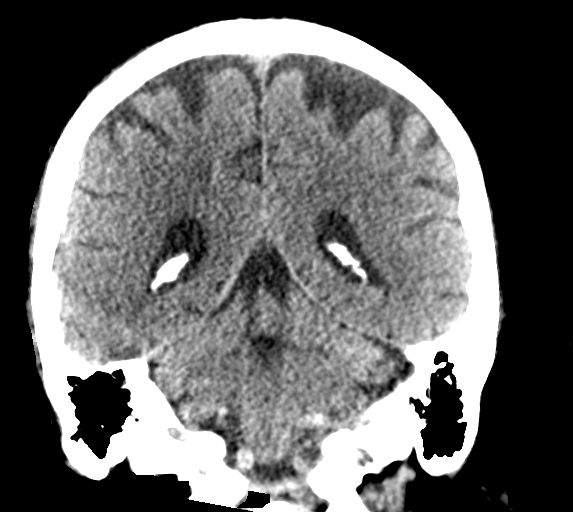
[im 31/70  brain]
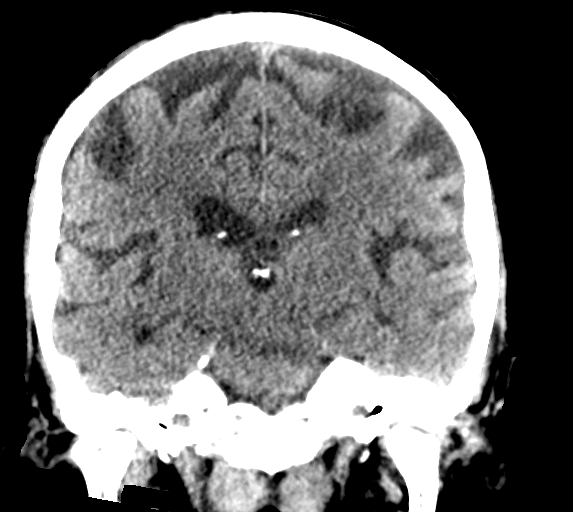
[im 39/70  brain]
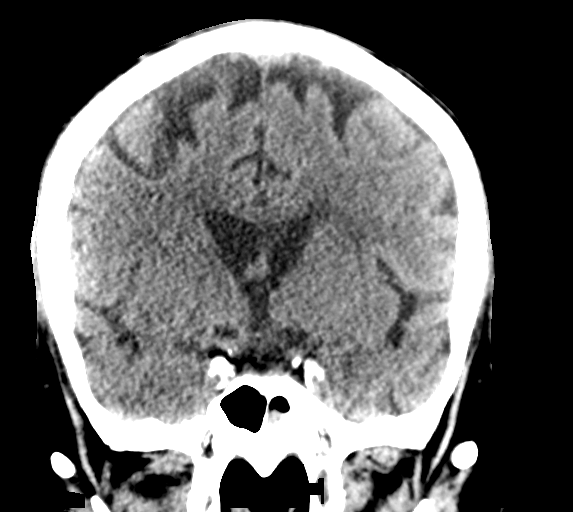

[Series 6: head without sag · sagittal · non-contrast · 0.31mm/px · 3 of 59 slices shown]
[im 24/59  brain]
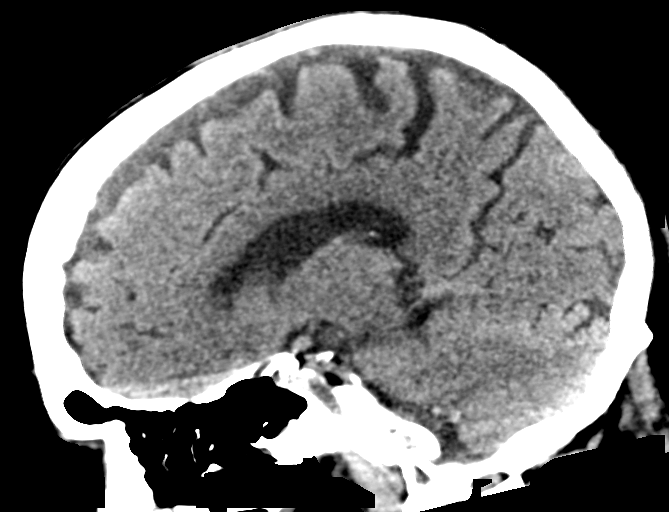
[im 30/59  brain]
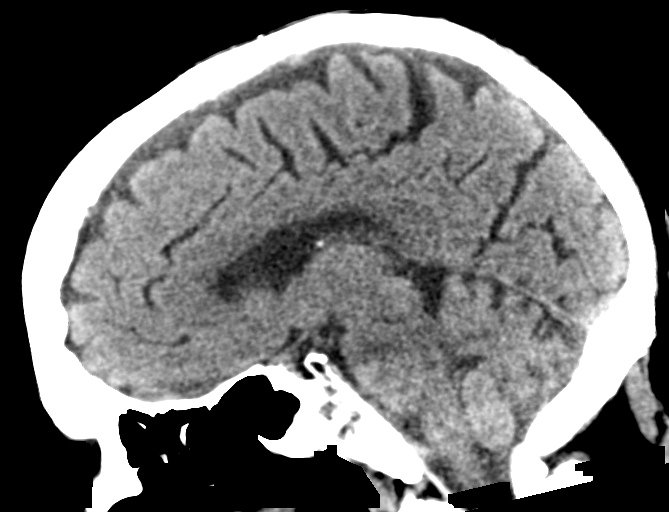
[im 36/59  brain]
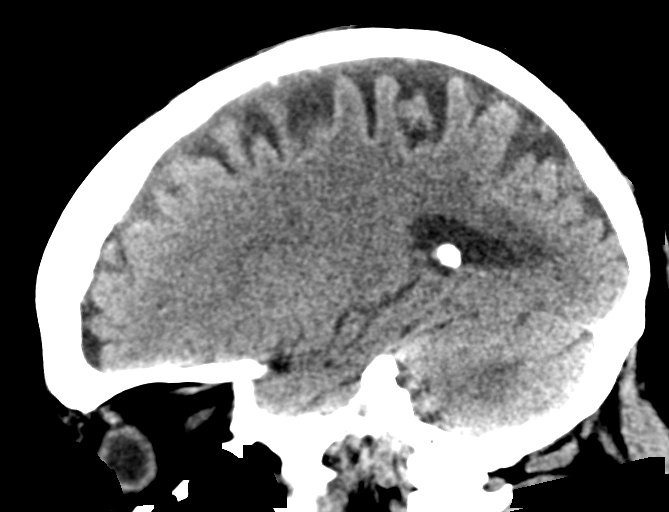

[15 of 47 positions shown; findings below may reference images not displayed]

FINDINGS: CT HEAD FINDINGS

Brain: No acute intracranial hemorrhage. No midline shift or mass
effect. Gray-white differentiation maintained. Mild volume loss.
Patchy hypodensities in the bilateral periventricular white matter
similar to prior. Unremarkable appearance of the ventricular system.

Vascular: Intracranial atherosclerosis.

Skull: Soft tissue swelling in the right frontal/supraorbital
region. No radiopaque foreign body. No acute fracture.

Sinuses/Orbits: Partial opacification of the sphenoid sinuses,
unchanged from the prior.

Other: None

CT CERVICAL SPINE FINDINGS

Cervical Spine:

Alignment: Craniocervical junction unremarkable. Alignment of the
cervical levels unchanged from the prior. No subluxation.

Skull base and vertebrae: No acute fracture at the skullbase.
Vertebral body heights relatively maintained. No acute fracture
identified.

Soft tissues and spinal canal: Unremarkable cervical soft tissues.
Lymph nodes are present, though not enlarged.

Disc levels: Disc space narrowing throughout the cervical spine.
Anterior osteophyte production throughout. Right greater than left
facet hypertrophy with associated uncovertebral joint disease,
contributes to bilateral varying degrees of foraminal narrowing
worst on the right at C3-C4 and C4-C5.

Upper chest: Unremarkable appearance of the lung apices.

Other: No canal hematoma identified.
IMPRESSION: Head CT:

No acute intracranial abnormality.

Mild paranasal sinus disease unchanged from prior.

Cervical CT:

No acute fracture or malalignment.

Multilevel degenerative changes

## 2022-06-14 IMAGING — CT CT CERVICAL SPINE W/O CM
2 series · 14 of 27 positions shown, 18 images · non-contrast
Comparison: None.

CLINICAL DATA: 81-year-old male with neck trauma, fall



[Series 4: c_spine 2.0 st · axial · 0.41mm/px · z∈[-208,-74]mm · 9 of 81 slices shown, 12 images]
[im 7/81  soft-tissue]
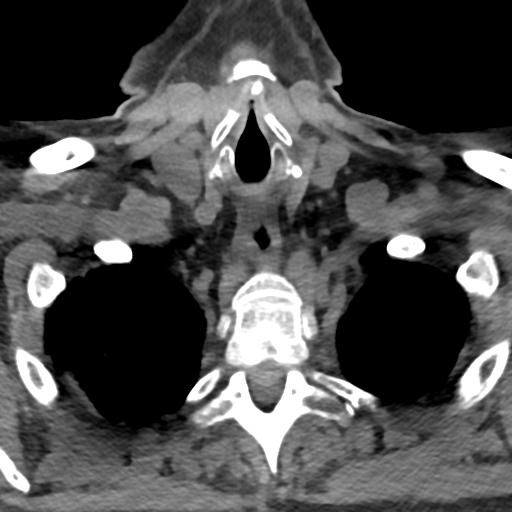
[im 7/81  bone]
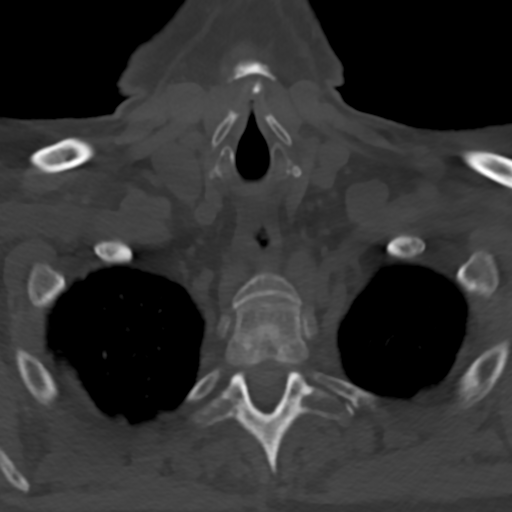
[im 19/81  bone]
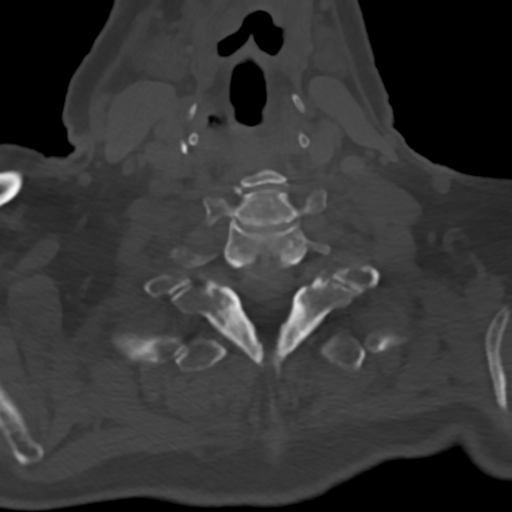
[im 25/81  bone]
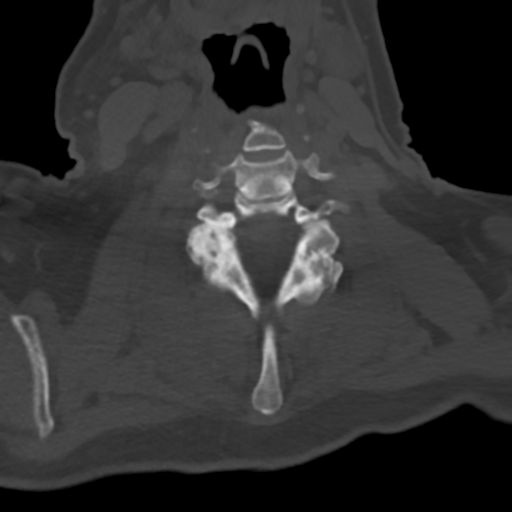
[im 31/81  bone]
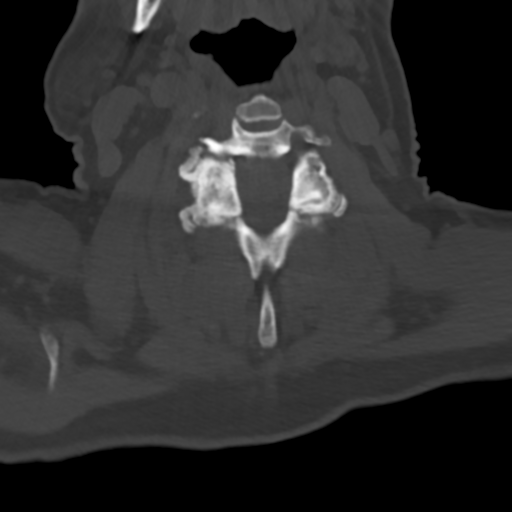
[im 44/81  soft-tissue]
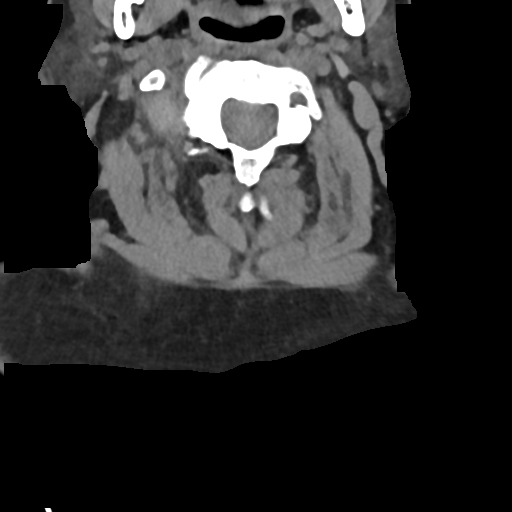
[im 44/81  bone]
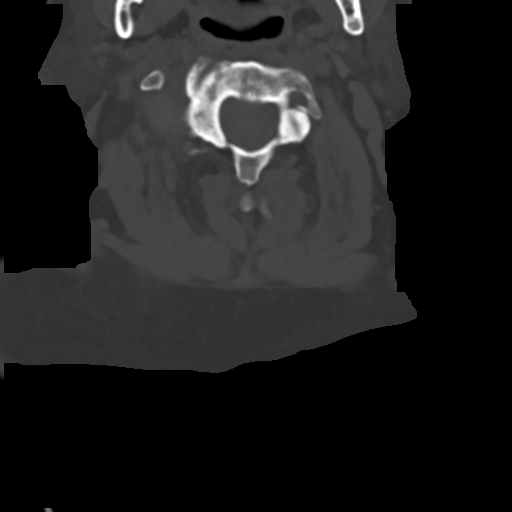
[im 50/81  bone]
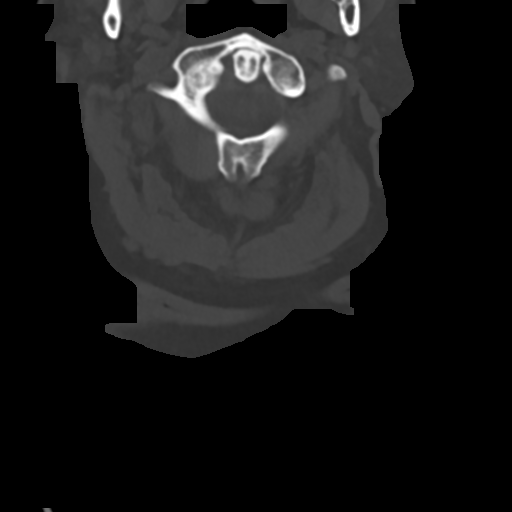
[im 56/81  bone]
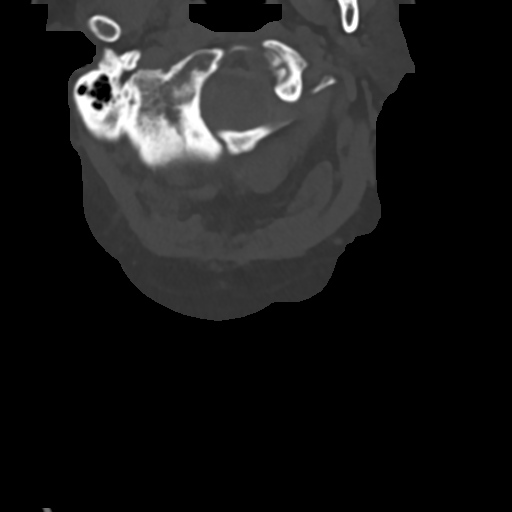
[im 68/81  bone]
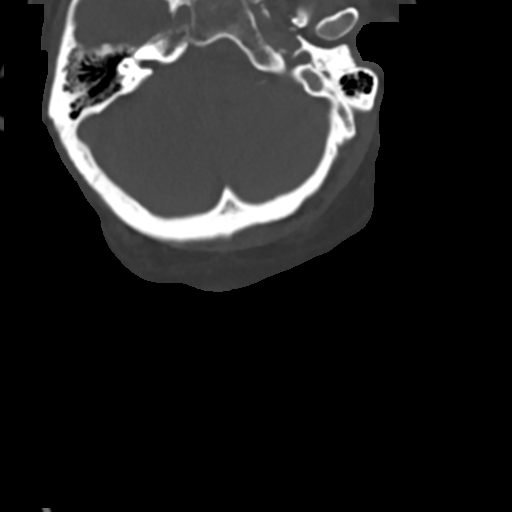
[im 74/81  soft-tissue]
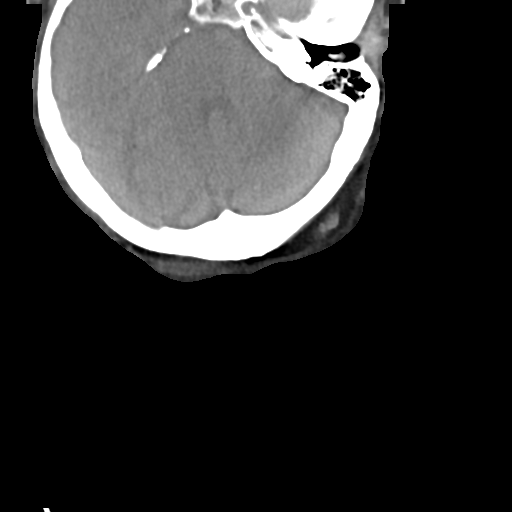
[im 74/81  bone]
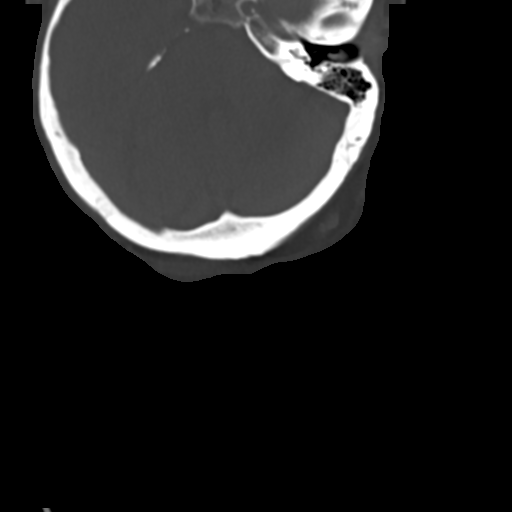

[Series 8: c_spine 2.0 sag bone · sagittal · 0.30mm/px · 5 of 61 slices shown, 6 images]
[im 21/61  bone]
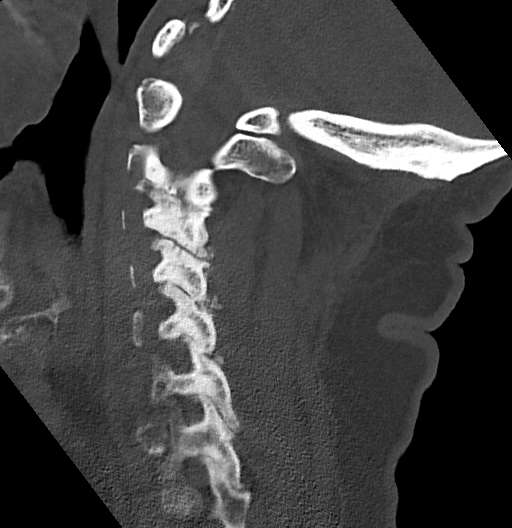
[im 26/61  bone]
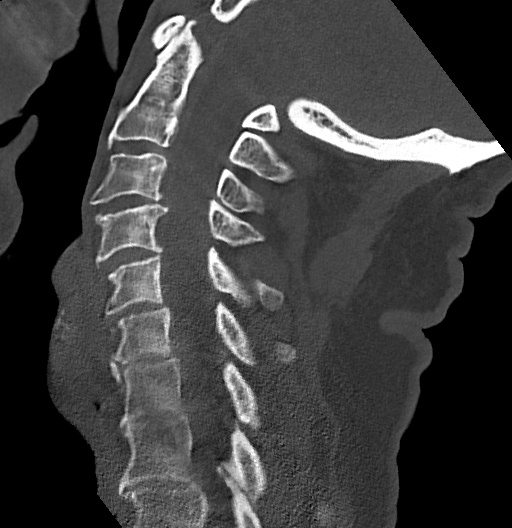
[im 31/61  soft-tissue]
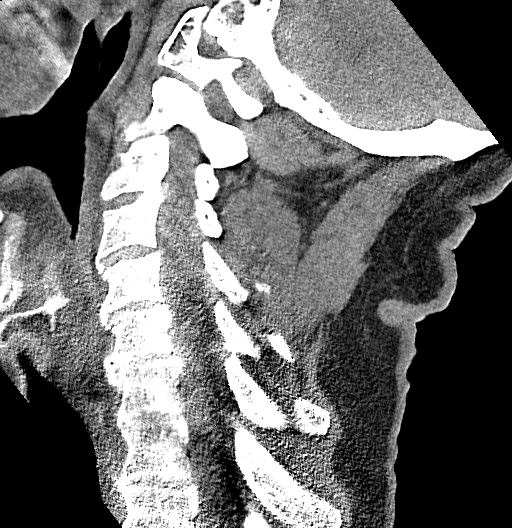
[im 31/61  bone]
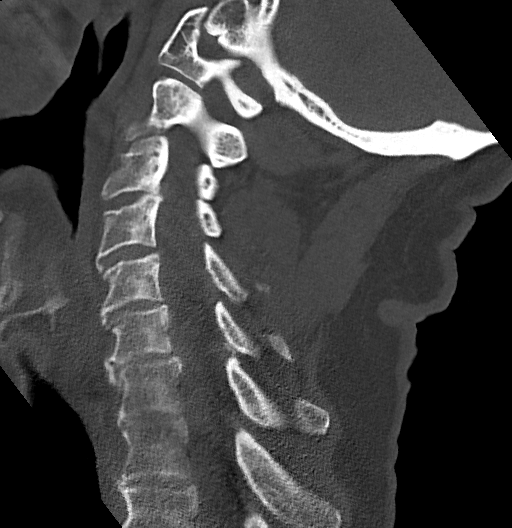
[im 36/61  bone]
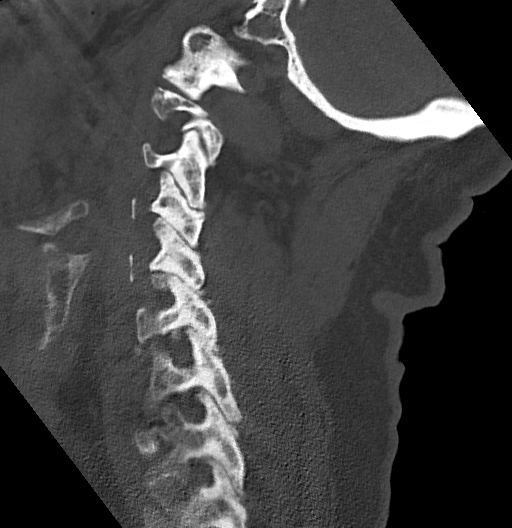
[im 41/61  bone]
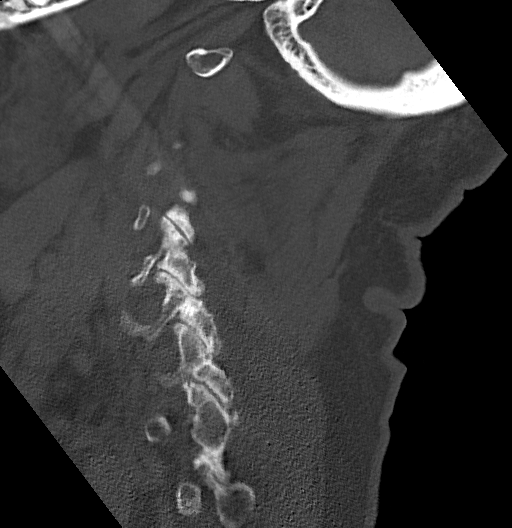

[14 of 27 positions shown; findings below may reference images not displayed]

FINDINGS: CT HEAD FINDINGS

Brain: No acute intracranial hemorrhage. No midline shift or mass
effect. Gray-white differentiation maintained. Mild volume loss.
Patchy hypodensities in the bilateral periventricular white matter
similar to prior. Unremarkable appearance of the ventricular system.

Vascular: Intracranial atherosclerosis.

Skull: Soft tissue swelling in the right frontal/supraorbital
region. No radiopaque foreign body. No acute fracture.

Sinuses/Orbits: Partial opacification of the sphenoid sinuses,
unchanged from the prior.

Other: None

CT CERVICAL SPINE FINDINGS

Cervical Spine:

Alignment: Craniocervical junction unremarkable. Alignment of the
cervical levels unchanged from the prior. No subluxation.

Skull base and vertebrae: No acute fracture at the skullbase.
Vertebral body heights relatively maintained. No acute fracture
identified.

Soft tissues and spinal canal: Unremarkable cervical soft tissues.
Lymph nodes are present, though not enlarged.

Disc levels: Disc space narrowing throughout the cervical spine.
Anterior osteophyte production throughout. Right greater than left
facet hypertrophy with associated uncovertebral joint disease,
contributes to bilateral varying degrees of foraminal narrowing
worst on the right at C3-C4 and C4-C5.

Upper chest: Unremarkable appearance of the lung apices.

Other: No canal hematoma identified.
IMPRESSION: Head CT:

No acute intracranial abnormality.

Mild paranasal sinus disease unchanged from prior.

Cervical CT:

No acute fracture or malalignment.

Multilevel degenerative changes

## 2022-06-15 DIAGNOSIS — F03A3 Unspecified dementia, mild, with mood disturbance: Secondary | ICD-10-CM | POA: Diagnosis not present

## 2022-06-15 DIAGNOSIS — M6281 Muscle weakness (generalized): Secondary | ICD-10-CM | POA: Diagnosis not present

## 2022-06-15 DIAGNOSIS — R2689 Other abnormalities of gait and mobility: Secondary | ICD-10-CM | POA: Diagnosis not present

## 2022-06-15 DIAGNOSIS — I10 Essential (primary) hypertension: Secondary | ICD-10-CM | POA: Diagnosis not present

## 2022-06-15 DIAGNOSIS — Z9181 History of falling: Secondary | ICD-10-CM | POA: Diagnosis not present

## 2022-06-15 DIAGNOSIS — W19XXXA Unspecified fall, initial encounter: Secondary | ICD-10-CM | POA: Diagnosis not present

## 2022-06-15 DIAGNOSIS — Z86718 Personal history of other venous thrombosis and embolism: Secondary | ICD-10-CM | POA: Diagnosis not present

## 2022-06-16 DIAGNOSIS — I10 Essential (primary) hypertension: Secondary | ICD-10-CM | POA: Diagnosis not present

## 2022-06-17 DIAGNOSIS — S61412A Laceration without foreign body of left hand, initial encounter: Secondary | ICD-10-CM | POA: Diagnosis not present

## 2022-06-17 DIAGNOSIS — D649 Anemia, unspecified: Secondary | ICD-10-CM | POA: Diagnosis not present

## 2022-06-17 DIAGNOSIS — E860 Dehydration: Secondary | ICD-10-CM | POA: Diagnosis not present

## 2022-06-17 DIAGNOSIS — N182 Chronic kidney disease, stage 2 (mild): Secondary | ICD-10-CM | POA: Diagnosis not present

## 2022-07-05 DIAGNOSIS — G300 Alzheimer's disease with early onset: Secondary | ICD-10-CM | POA: Diagnosis not present

## 2022-07-05 DIAGNOSIS — F02B3 Dementia in other diseases classified elsewhere, moderate, with mood disturbance: Secondary | ICD-10-CM | POA: Diagnosis not present

## 2022-07-15 DIAGNOSIS — L539 Erythematous condition, unspecified: Secondary | ICD-10-CM | POA: Diagnosis not present

## 2022-07-16 DIAGNOSIS — I10 Essential (primary) hypertension: Secondary | ICD-10-CM | POA: Diagnosis not present

## 2022-07-16 DIAGNOSIS — Z86718 Personal history of other venous thrombosis and embolism: Secondary | ICD-10-CM | POA: Diagnosis not present

## 2022-07-16 DIAGNOSIS — E569 Vitamin deficiency, unspecified: Secondary | ICD-10-CM | POA: Diagnosis not present

## 2022-08-02 DIAGNOSIS — G300 Alzheimer's disease with early onset: Secondary | ICD-10-CM | POA: Diagnosis not present

## 2022-08-02 DIAGNOSIS — F02B3 Dementia in other diseases classified elsewhere, moderate, with mood disturbance: Secondary | ICD-10-CM | POA: Diagnosis not present

## 2022-08-06 DIAGNOSIS — Z7901 Long term (current) use of anticoagulants: Secondary | ICD-10-CM | POA: Diagnosis not present

## 2022-08-06 DIAGNOSIS — K5900 Constipation, unspecified: Secondary | ICD-10-CM | POA: Diagnosis not present

## 2022-08-06 DIAGNOSIS — I10 Essential (primary) hypertension: Secondary | ICD-10-CM | POA: Diagnosis not present

## 2022-08-26 DIAGNOSIS — M6282 Rhabdomyolysis: Secondary | ICD-10-CM | POA: Diagnosis not present

## 2022-08-26 DIAGNOSIS — R278 Other lack of coordination: Secondary | ICD-10-CM | POA: Diagnosis not present

## 2022-08-30 DIAGNOSIS — F02B3 Dementia in other diseases classified elsewhere, moderate, with mood disturbance: Secondary | ICD-10-CM | POA: Diagnosis not present

## 2022-08-30 DIAGNOSIS — G300 Alzheimer's disease with early onset: Secondary | ICD-10-CM | POA: Diagnosis not present

## 2022-09-15 DIAGNOSIS — K5900 Constipation, unspecified: Secondary | ICD-10-CM | POA: Diagnosis not present

## 2022-09-15 DIAGNOSIS — Z7901 Long term (current) use of anticoagulants: Secondary | ICD-10-CM | POA: Diagnosis not present

## 2022-09-15 DIAGNOSIS — I10 Essential (primary) hypertension: Secondary | ICD-10-CM | POA: Diagnosis not present

## 2022-09-15 DIAGNOSIS — R2689 Other abnormalities of gait and mobility: Secondary | ICD-10-CM | POA: Diagnosis not present

## 2022-09-15 DIAGNOSIS — M6281 Muscle weakness (generalized): Secondary | ICD-10-CM | POA: Diagnosis not present

## 2022-09-16 DIAGNOSIS — I10 Essential (primary) hypertension: Secondary | ICD-10-CM | POA: Diagnosis not present

## 2022-09-17 DIAGNOSIS — N182 Chronic kidney disease, stage 2 (mild): Secondary | ICD-10-CM | POA: Diagnosis not present

## 2022-09-17 DIAGNOSIS — R2689 Other abnormalities of gait and mobility: Secondary | ICD-10-CM | POA: Diagnosis not present

## 2022-09-17 DIAGNOSIS — M6281 Muscle weakness (generalized): Secondary | ICD-10-CM | POA: Diagnosis not present

## 2022-09-17 DIAGNOSIS — D649 Anemia, unspecified: Secondary | ICD-10-CM | POA: Diagnosis not present

## 2022-09-27 DIAGNOSIS — G300 Alzheimer's disease with early onset: Secondary | ICD-10-CM | POA: Diagnosis not present

## 2022-09-27 DIAGNOSIS — F02B3 Dementia in other diseases classified elsewhere, moderate, with mood disturbance: Secondary | ICD-10-CM | POA: Diagnosis not present

## 2022-10-22 DIAGNOSIS — Z7901 Long term (current) use of anticoagulants: Secondary | ICD-10-CM | POA: Diagnosis not present

## 2022-10-22 DIAGNOSIS — M6281 Muscle weakness (generalized): Secondary | ICD-10-CM | POA: Diagnosis not present

## 2022-10-22 DIAGNOSIS — K5900 Constipation, unspecified: Secondary | ICD-10-CM | POA: Diagnosis not present

## 2022-10-22 DIAGNOSIS — Z9181 History of falling: Secondary | ICD-10-CM | POA: Diagnosis not present

## 2022-10-22 DIAGNOSIS — I10 Essential (primary) hypertension: Secondary | ICD-10-CM | POA: Diagnosis not present

## 2022-10-22 DIAGNOSIS — R2689 Other abnormalities of gait and mobility: Secondary | ICD-10-CM | POA: Diagnosis not present

## 2022-10-25 DIAGNOSIS — F02B3 Dementia in other diseases classified elsewhere, moderate, with mood disturbance: Secondary | ICD-10-CM | POA: Diagnosis not present

## 2022-10-25 DIAGNOSIS — G300 Alzheimer's disease with early onset: Secondary | ICD-10-CM | POA: Diagnosis not present

## 2022-10-29 DIAGNOSIS — Z7689 Persons encountering health services in other specified circumstances: Secondary | ICD-10-CM | POA: Diagnosis not present

## 2022-10-29 DIAGNOSIS — I10 Essential (primary) hypertension: Secondary | ICD-10-CM | POA: Diagnosis not present

## 2022-10-29 DIAGNOSIS — Z9181 History of falling: Secondary | ICD-10-CM | POA: Diagnosis not present

## 2022-11-02 DIAGNOSIS — I951 Orthostatic hypotension: Secondary | ICD-10-CM | POA: Diagnosis not present

## 2022-11-03 DIAGNOSIS — R279 Unspecified lack of coordination: Secondary | ICD-10-CM | POA: Diagnosis not present

## 2022-11-03 DIAGNOSIS — M6281 Muscle weakness (generalized): Secondary | ICD-10-CM | POA: Diagnosis not present

## 2022-11-04 DIAGNOSIS — M6281 Muscle weakness (generalized): Secondary | ICD-10-CM | POA: Diagnosis not present

## 2022-11-04 DIAGNOSIS — R279 Unspecified lack of coordination: Secondary | ICD-10-CM | POA: Diagnosis not present

## 2022-11-05 DIAGNOSIS — R279 Unspecified lack of coordination: Secondary | ICD-10-CM | POA: Diagnosis not present

## 2022-11-05 DIAGNOSIS — M6281 Muscle weakness (generalized): Secondary | ICD-10-CM | POA: Diagnosis not present

## 2022-11-12 DIAGNOSIS — W19XXXA Unspecified fall, initial encounter: Secondary | ICD-10-CM | POA: Diagnosis not present

## 2022-11-12 DIAGNOSIS — Z9181 History of falling: Secondary | ICD-10-CM | POA: Diagnosis not present

## 2022-11-12 DIAGNOSIS — R2689 Other abnormalities of gait and mobility: Secondary | ICD-10-CM | POA: Diagnosis not present

## 2022-11-12 DIAGNOSIS — R52 Pain, unspecified: Secondary | ICD-10-CM | POA: Diagnosis not present

## 2022-11-12 DIAGNOSIS — M6281 Muscle weakness (generalized): Secondary | ICD-10-CM | POA: Diagnosis not present

## 2022-11-13 DIAGNOSIS — R279 Unspecified lack of coordination: Secondary | ICD-10-CM | POA: Diagnosis not present

## 2022-11-13 DIAGNOSIS — M6281 Muscle weakness (generalized): Secondary | ICD-10-CM | POA: Diagnosis not present

## 2022-11-15 DIAGNOSIS — M6281 Muscle weakness (generalized): Secondary | ICD-10-CM | POA: Diagnosis not present

## 2022-11-15 DIAGNOSIS — R279 Unspecified lack of coordination: Secondary | ICD-10-CM | POA: Diagnosis not present

## 2022-11-17 DIAGNOSIS — M6281 Muscle weakness (generalized): Secondary | ICD-10-CM | POA: Diagnosis not present

## 2022-11-17 DIAGNOSIS — R279 Unspecified lack of coordination: Secondary | ICD-10-CM | POA: Diagnosis not present

## 2022-11-22 DIAGNOSIS — G300 Alzheimer's disease with early onset: Secondary | ICD-10-CM | POA: Diagnosis not present

## 2022-11-22 DIAGNOSIS — R279 Unspecified lack of coordination: Secondary | ICD-10-CM | POA: Diagnosis not present

## 2022-11-22 DIAGNOSIS — M6281 Muscle weakness (generalized): Secondary | ICD-10-CM | POA: Diagnosis not present

## 2022-11-22 DIAGNOSIS — F02B3 Dementia in other diseases classified elsewhere, moderate, with mood disturbance: Secondary | ICD-10-CM | POA: Diagnosis not present

## 2022-11-23 DIAGNOSIS — M6281 Muscle weakness (generalized): Secondary | ICD-10-CM | POA: Diagnosis not present

## 2022-11-23 DIAGNOSIS — R279 Unspecified lack of coordination: Secondary | ICD-10-CM | POA: Diagnosis not present

## 2022-11-24 DIAGNOSIS — R279 Unspecified lack of coordination: Secondary | ICD-10-CM | POA: Diagnosis not present

## 2022-11-24 DIAGNOSIS — M6281 Muscle weakness (generalized): Secondary | ICD-10-CM | POA: Diagnosis not present

## 2022-12-13 DIAGNOSIS — G300 Alzheimer's disease with early onset: Secondary | ICD-10-CM | POA: Diagnosis not present

## 2022-12-13 DIAGNOSIS — F02B3 Dementia in other diseases classified elsewhere, moderate, with mood disturbance: Secondary | ICD-10-CM | POA: Diagnosis not present

## 2023-01-11 DIAGNOSIS — U071 COVID-19: Secondary | ICD-10-CM | POA: Diagnosis not present

## 2023-01-12 DIAGNOSIS — E0829 Diabetes mellitus due to underlying condition with other diabetic kidney complication: Secondary | ICD-10-CM | POA: Diagnosis not present

## 2023-01-12 DIAGNOSIS — E559 Vitamin D deficiency, unspecified: Secondary | ICD-10-CM | POA: Diagnosis not present

## 2023-01-13 DIAGNOSIS — U071 COVID-19: Secondary | ICD-10-CM | POA: Diagnosis not present

## 2023-01-13 DIAGNOSIS — D649 Anemia, unspecified: Secondary | ICD-10-CM | POA: Diagnosis not present

## 2023-01-13 DIAGNOSIS — R062 Wheezing: Secondary | ICD-10-CM | POA: Diagnosis not present

## 2023-01-13 DIAGNOSIS — N182 Chronic kidney disease, stage 2 (mild): Secondary | ICD-10-CM | POA: Diagnosis not present

## 2023-01-13 DIAGNOSIS — Z1152 Encounter for screening for COVID-19: Secondary | ICD-10-CM | POA: Diagnosis not present

## 2023-01-19 DIAGNOSIS — Z86718 Personal history of other venous thrombosis and embolism: Secondary | ICD-10-CM | POA: Diagnosis not present

## 2023-01-19 DIAGNOSIS — E569 Vitamin deficiency, unspecified: Secondary | ICD-10-CM | POA: Diagnosis not present

## 2023-01-19 DIAGNOSIS — I10 Essential (primary) hypertension: Secondary | ICD-10-CM | POA: Diagnosis not present

## 2023-01-24 DIAGNOSIS — G300 Alzheimer's disease with early onset: Secondary | ICD-10-CM | POA: Diagnosis not present

## 2023-01-24 DIAGNOSIS — F02B3 Dementia in other diseases classified elsewhere, moderate, with mood disturbance: Secondary | ICD-10-CM | POA: Diagnosis not present

## 2023-02-04 DIAGNOSIS — Z6828 Body mass index (BMI) 28.0-28.9, adult: Secondary | ICD-10-CM

## 2023-02-04 DIAGNOSIS — Z9181 History of falling: Secondary | ICD-10-CM

## 2023-02-04 DIAGNOSIS — I10 Essential (primary) hypertension: Secondary | ICD-10-CM

## 2023-02-04 DIAGNOSIS — R2689 Other abnormalities of gait and mobility: Secondary | ICD-10-CM

## 2023-02-04 DIAGNOSIS — F331 Major depressive disorder, recurrent, moderate: Secondary | ICD-10-CM

## 2023-02-04 DIAGNOSIS — Z7901 Long term (current) use of anticoagulants: Secondary | ICD-10-CM

## 2023-02-04 DIAGNOSIS — Z8616 Personal history of COVID-19: Secondary | ICD-10-CM

## 2023-02-04 DIAGNOSIS — M6281 Muscle weakness (generalized): Secondary | ICD-10-CM

## 2023-02-04 DIAGNOSIS — F0390 Unspecified dementia without behavioral disturbance: Secondary | ICD-10-CM

## 2023-02-18 DIAGNOSIS — I10 Essential (primary) hypertension: Secondary | ICD-10-CM | POA: Diagnosis not present

## 2023-02-21 DIAGNOSIS — H35013 Changes in retinal vascular appearance, bilateral: Secondary | ICD-10-CM | POA: Diagnosis not present

## 2023-02-21 DIAGNOSIS — H532 Diplopia: Secondary | ICD-10-CM | POA: Diagnosis not present

## 2023-02-21 DIAGNOSIS — F02B3 Dementia in other diseases classified elsewhere, moderate, with mood disturbance: Secondary | ICD-10-CM | POA: Diagnosis not present

## 2023-02-21 DIAGNOSIS — G300 Alzheimer's disease with early onset: Secondary | ICD-10-CM | POA: Diagnosis not present

## 2023-03-09 DIAGNOSIS — M21611 Bunion of right foot: Secondary | ICD-10-CM | POA: Diagnosis not present

## 2023-03-09 DIAGNOSIS — M21612 Bunion of left foot: Secondary | ICD-10-CM | POA: Diagnosis not present

## 2023-03-09 DIAGNOSIS — B351 Tinea unguium: Secondary | ICD-10-CM | POA: Diagnosis not present

## 2023-03-09 DIAGNOSIS — L84 Corns and callosities: Secondary | ICD-10-CM | POA: Diagnosis not present

## 2023-03-09 DIAGNOSIS — I739 Peripheral vascular disease, unspecified: Secondary | ICD-10-CM | POA: Diagnosis not present

## 2023-03-18 DIAGNOSIS — I10 Essential (primary) hypertension: Secondary | ICD-10-CM | POA: Diagnosis not present

## 2023-03-21 DIAGNOSIS — F02B3 Dementia in other diseases classified elsewhere, moderate, with mood disturbance: Secondary | ICD-10-CM | POA: Diagnosis not present

## 2023-03-21 DIAGNOSIS — G300 Alzheimer's disease with early onset: Secondary | ICD-10-CM | POA: Diagnosis not present

## 2023-03-22 DIAGNOSIS — R278 Other lack of coordination: Secondary | ICD-10-CM | POA: Diagnosis not present

## 2023-03-22 DIAGNOSIS — M6281 Muscle weakness (generalized): Secondary | ICD-10-CM | POA: Diagnosis not present

## 2023-03-22 DIAGNOSIS — J9601 Acute respiratory failure with hypoxia: Secondary | ICD-10-CM | POA: Diagnosis not present

## 2023-03-24 DIAGNOSIS — R278 Other lack of coordination: Secondary | ICD-10-CM | POA: Diagnosis not present

## 2023-03-24 DIAGNOSIS — M6281 Muscle weakness (generalized): Secondary | ICD-10-CM | POA: Diagnosis not present

## 2023-03-24 DIAGNOSIS — J9601 Acute respiratory failure with hypoxia: Secondary | ICD-10-CM | POA: Diagnosis not present

## 2023-03-28 DIAGNOSIS — M6281 Muscle weakness (generalized): Secondary | ICD-10-CM | POA: Diagnosis not present

## 2023-03-28 DIAGNOSIS — J9601 Acute respiratory failure with hypoxia: Secondary | ICD-10-CM | POA: Diagnosis not present

## 2023-03-29 DIAGNOSIS — M6281 Muscle weakness (generalized): Secondary | ICD-10-CM | POA: Diagnosis not present

## 2023-03-29 DIAGNOSIS — J9601 Acute respiratory failure with hypoxia: Secondary | ICD-10-CM | POA: Diagnosis not present

## 2023-03-31 DIAGNOSIS — J9601 Acute respiratory failure with hypoxia: Secondary | ICD-10-CM | POA: Diagnosis not present

## 2023-03-31 DIAGNOSIS — M6281 Muscle weakness (generalized): Secondary | ICD-10-CM | POA: Diagnosis not present

## 2023-04-04 DIAGNOSIS — Z86718 Personal history of other venous thrombosis and embolism: Secondary | ICD-10-CM | POA: Diagnosis not present

## 2023-04-04 DIAGNOSIS — M6281 Muscle weakness (generalized): Secondary | ICD-10-CM | POA: Diagnosis not present

## 2023-04-04 DIAGNOSIS — R2689 Other abnormalities of gait and mobility: Secondary | ICD-10-CM | POA: Diagnosis not present

## 2023-04-04 DIAGNOSIS — I10 Essential (primary) hypertension: Secondary | ICD-10-CM | POA: Diagnosis not present

## 2023-04-04 DIAGNOSIS — J9601 Acute respiratory failure with hypoxia: Secondary | ICD-10-CM | POA: Diagnosis not present

## 2023-04-05 DIAGNOSIS — M6281 Muscle weakness (generalized): Secondary | ICD-10-CM | POA: Diagnosis not present

## 2023-04-05 DIAGNOSIS — J9601 Acute respiratory failure with hypoxia: Secondary | ICD-10-CM | POA: Diagnosis not present

## 2023-04-07 DIAGNOSIS — J9601 Acute respiratory failure with hypoxia: Secondary | ICD-10-CM | POA: Diagnosis not present

## 2023-04-07 DIAGNOSIS — M6281 Muscle weakness (generalized): Secondary | ICD-10-CM | POA: Diagnosis not present

## 2023-04-11 DIAGNOSIS — J9601 Acute respiratory failure with hypoxia: Secondary | ICD-10-CM | POA: Diagnosis not present

## 2023-04-11 DIAGNOSIS — M6281 Muscle weakness (generalized): Secondary | ICD-10-CM | POA: Diagnosis not present

## 2023-04-12 DIAGNOSIS — M6281 Muscle weakness (generalized): Secondary | ICD-10-CM | POA: Diagnosis not present

## 2023-04-12 DIAGNOSIS — J9601 Acute respiratory failure with hypoxia: Secondary | ICD-10-CM | POA: Diagnosis not present

## 2023-04-13 DIAGNOSIS — J9601 Acute respiratory failure with hypoxia: Secondary | ICD-10-CM | POA: Diagnosis not present

## 2023-04-14 DIAGNOSIS — M6281 Muscle weakness (generalized): Secondary | ICD-10-CM | POA: Diagnosis not present

## 2023-04-14 DIAGNOSIS — J9601 Acute respiratory failure with hypoxia: Secondary | ICD-10-CM | POA: Diagnosis not present

## 2023-04-18 DIAGNOSIS — J9601 Acute respiratory failure with hypoxia: Secondary | ICD-10-CM | POA: Diagnosis not present

## 2023-04-18 DIAGNOSIS — M6281 Muscle weakness (generalized): Secondary | ICD-10-CM | POA: Diagnosis not present

## 2023-04-19 DIAGNOSIS — M6281 Muscle weakness (generalized): Secondary | ICD-10-CM | POA: Diagnosis not present

## 2023-04-19 DIAGNOSIS — J9601 Acute respiratory failure with hypoxia: Secondary | ICD-10-CM | POA: Diagnosis not present

## 2023-04-21 DIAGNOSIS — M6281 Muscle weakness (generalized): Secondary | ICD-10-CM | POA: Diagnosis not present

## 2023-04-21 DIAGNOSIS — J9601 Acute respiratory failure with hypoxia: Secondary | ICD-10-CM | POA: Diagnosis not present

## 2023-04-25 DIAGNOSIS — F02B3 Dementia in other diseases classified elsewhere, moderate, with mood disturbance: Secondary | ICD-10-CM | POA: Diagnosis not present

## 2023-04-25 DIAGNOSIS — J9601 Acute respiratory failure with hypoxia: Secondary | ICD-10-CM | POA: Diagnosis not present

## 2023-04-25 DIAGNOSIS — M6281 Muscle weakness (generalized): Secondary | ICD-10-CM | POA: Diagnosis not present

## 2023-04-25 DIAGNOSIS — G300 Alzheimer's disease with early onset: Secondary | ICD-10-CM | POA: Diagnosis not present

## 2023-04-26 DIAGNOSIS — M6281 Muscle weakness (generalized): Secondary | ICD-10-CM | POA: Diagnosis not present

## 2023-04-26 DIAGNOSIS — J9601 Acute respiratory failure with hypoxia: Secondary | ICD-10-CM | POA: Diagnosis not present

## 2023-04-28 DIAGNOSIS — M6281 Muscle weakness (generalized): Secondary | ICD-10-CM | POA: Diagnosis not present

## 2023-04-28 DIAGNOSIS — J9601 Acute respiratory failure with hypoxia: Secondary | ICD-10-CM | POA: Diagnosis not present

## 2023-05-02 DIAGNOSIS — M6281 Muscle weakness (generalized): Secondary | ICD-10-CM | POA: Diagnosis not present

## 2023-05-02 DIAGNOSIS — J9601 Acute respiratory failure with hypoxia: Secondary | ICD-10-CM | POA: Diagnosis not present

## 2023-05-03 DIAGNOSIS — J9601 Acute respiratory failure with hypoxia: Secondary | ICD-10-CM | POA: Diagnosis not present

## 2023-05-03 DIAGNOSIS — M6281 Muscle weakness (generalized): Secondary | ICD-10-CM | POA: Diagnosis not present

## 2023-05-05 DIAGNOSIS — J9601 Acute respiratory failure with hypoxia: Secondary | ICD-10-CM | POA: Diagnosis not present

## 2023-05-05 DIAGNOSIS — M6281 Muscle weakness (generalized): Secondary | ICD-10-CM | POA: Diagnosis not present

## 2023-05-10 DIAGNOSIS — I739 Peripheral vascular disease, unspecified: Secondary | ICD-10-CM | POA: Diagnosis not present

## 2023-05-10 DIAGNOSIS — L603 Nail dystrophy: Secondary | ICD-10-CM | POA: Diagnosis not present

## 2023-06-06 DIAGNOSIS — G300 Alzheimer's disease with early onset: Secondary | ICD-10-CM | POA: Diagnosis not present

## 2023-06-06 DIAGNOSIS — F02B3 Dementia in other diseases classified elsewhere, moderate, with mood disturbance: Secondary | ICD-10-CM | POA: Diagnosis not present

## 2023-06-22 DIAGNOSIS — F0390 Unspecified dementia without behavioral disturbance: Secondary | ICD-10-CM | POA: Diagnosis not present

## 2023-06-22 DIAGNOSIS — Z9181 History of falling: Secondary | ICD-10-CM | POA: Diagnosis not present

## 2023-06-22 DIAGNOSIS — F331 Major depressive disorder, recurrent, moderate: Secondary | ICD-10-CM

## 2023-06-22 DIAGNOSIS — R2689 Other abnormalities of gait and mobility: Secondary | ICD-10-CM | POA: Diagnosis not present

## 2023-06-22 DIAGNOSIS — K5900 Constipation, unspecified: Secondary | ICD-10-CM | POA: Diagnosis not present

## 2023-06-22 DIAGNOSIS — Z6828 Body mass index (BMI) 28.0-28.9, adult: Secondary | ICD-10-CM

## 2023-06-22 DIAGNOSIS — M6281 Muscle weakness (generalized): Secondary | ICD-10-CM | POA: Diagnosis not present

## 2023-06-22 DIAGNOSIS — I10 Essential (primary) hypertension: Secondary | ICD-10-CM | POA: Diagnosis not present

## 2023-06-22 DIAGNOSIS — Z7901 Long term (current) use of anticoagulants: Secondary | ICD-10-CM | POA: Diagnosis not present

## 2023-06-22 DIAGNOSIS — Z8616 Personal history of COVID-19: Secondary | ICD-10-CM | POA: Diagnosis not present

## 2023-06-23 DIAGNOSIS — E119 Type 2 diabetes mellitus without complications: Secondary | ICD-10-CM | POA: Diagnosis not present

## 2023-06-23 DIAGNOSIS — E559 Vitamin D deficiency, unspecified: Secondary | ICD-10-CM | POA: Diagnosis not present

## 2023-06-29 DIAGNOSIS — E559 Vitamin D deficiency, unspecified: Secondary | ICD-10-CM

## 2023-06-29 DIAGNOSIS — K117 Disturbances of salivary secretion: Secondary | ICD-10-CM

## 2023-06-29 DIAGNOSIS — F0390 Unspecified dementia without behavioral disturbance: Secondary | ICD-10-CM

## 2023-06-29 DIAGNOSIS — Z7189 Other specified counseling: Secondary | ICD-10-CM

## 2023-07-04 DIAGNOSIS — K5900 Constipation, unspecified: Secondary | ICD-10-CM | POA: Diagnosis not present

## 2023-07-04 DIAGNOSIS — R2689 Other abnormalities of gait and mobility: Secondary | ICD-10-CM

## 2023-07-04 DIAGNOSIS — F0390 Unspecified dementia without behavioral disturbance: Secondary | ICD-10-CM | POA: Diagnosis not present

## 2023-07-04 DIAGNOSIS — N182 Chronic kidney disease, stage 2 (mild): Secondary | ICD-10-CM | POA: Diagnosis not present

## 2023-07-04 DIAGNOSIS — M6281 Muscle weakness (generalized): Secondary | ICD-10-CM | POA: Diagnosis not present

## 2023-07-04 DIAGNOSIS — N390 Urinary tract infection, site not specified: Secondary | ICD-10-CM

## 2023-07-05 ENCOUNTER — Encounter (HOSPITAL_COMMUNITY): Payer: Self-pay

## 2023-07-05 ENCOUNTER — Emergency Department (HOSPITAL_COMMUNITY): Payer: Medicare Other

## 2023-07-05 ENCOUNTER — Inpatient Hospital Stay (HOSPITAL_COMMUNITY): Payer: Medicare Other

## 2023-07-05 ENCOUNTER — Inpatient Hospital Stay (HOSPITAL_COMMUNITY)
Admission: EM | Admit: 2023-07-05 | Discharge: 2023-07-11 | DRG: 177 | Disposition: A | Discharge status: Skilled Nursing Facility | Payer: Medicare Other | Source: Skilled Nursing Facility | Attending: Internal Medicine | Admitting: Internal Medicine

## 2023-07-05 ENCOUNTER — Other Ambulatory Visit: Payer: Self-pay

## 2023-07-05 DIAGNOSIS — I4892 Unspecified atrial flutter: Secondary | ICD-10-CM | POA: Diagnosis present

## 2023-07-05 DIAGNOSIS — E870 Hyperosmolality and hypernatremia: Secondary | ICD-10-CM | POA: Diagnosis not present

## 2023-07-05 DIAGNOSIS — Z7901 Long term (current) use of anticoagulants: Secondary | ICD-10-CM | POA: Diagnosis not present

## 2023-07-05 DIAGNOSIS — Z7982 Long term (current) use of aspirin: Secondary | ICD-10-CM

## 2023-07-05 DIAGNOSIS — K5900 Constipation, unspecified: Secondary | ICD-10-CM | POA: Diagnosis not present

## 2023-07-05 DIAGNOSIS — I4891 Unspecified atrial fibrillation: Secondary | ICD-10-CM | POA: Diagnosis not present

## 2023-07-05 DIAGNOSIS — N182 Chronic kidney disease, stage 2 (mild): Secondary | ICD-10-CM | POA: Diagnosis not present

## 2023-07-05 DIAGNOSIS — J9601 Acute respiratory failure with hypoxia: Secondary | ICD-10-CM | POA: Diagnosis not present

## 2023-07-05 DIAGNOSIS — D649 Anemia, unspecified: Secondary | ICD-10-CM | POA: Diagnosis not present

## 2023-07-05 DIAGNOSIS — G9340 Encephalopathy, unspecified: Secondary | ICD-10-CM | POA: Diagnosis present

## 2023-07-05 DIAGNOSIS — E861 Hypovolemia: Secondary | ICD-10-CM | POA: Diagnosis present

## 2023-07-05 DIAGNOSIS — Z86718 Personal history of other venous thrombosis and embolism: Secondary | ICD-10-CM | POA: Diagnosis not present

## 2023-07-05 DIAGNOSIS — E876 Hypokalemia: Secondary | ICD-10-CM | POA: Diagnosis present

## 2023-07-05 DIAGNOSIS — I10 Essential (primary) hypertension: Secondary | ICD-10-CM | POA: Diagnosis present

## 2023-07-05 DIAGNOSIS — E875 Hyperkalemia: Secondary | ICD-10-CM | POA: Diagnosis not present

## 2023-07-05 DIAGNOSIS — R2689 Other abnormalities of gait and mobility: Secondary | ICD-10-CM

## 2023-07-05 DIAGNOSIS — J690 Pneumonitis due to inhalation of food and vomit: Principal | ICD-10-CM

## 2023-07-05 DIAGNOSIS — Z8616 Personal history of COVID-19: Secondary | ICD-10-CM | POA: Diagnosis not present

## 2023-07-05 DIAGNOSIS — G9341 Metabolic encephalopathy: Secondary | ICD-10-CM | POA: Diagnosis present

## 2023-07-05 DIAGNOSIS — E871 Hypo-osmolality and hyponatremia: Secondary | ICD-10-CM | POA: Diagnosis not present

## 2023-07-05 DIAGNOSIS — E860 Dehydration: Secondary | ICD-10-CM | POA: Diagnosis present

## 2023-07-05 DIAGNOSIS — F0280 Dementia in other diseases classified elsewhere without behavioral disturbance: Secondary | ICD-10-CM | POA: Diagnosis present

## 2023-07-05 DIAGNOSIS — F0150 Vascular dementia without behavioral disturbance: Secondary | ICD-10-CM | POA: Diagnosis present

## 2023-07-05 DIAGNOSIS — D509 Iron deficiency anemia, unspecified: Secondary | ICD-10-CM

## 2023-07-05 DIAGNOSIS — Z66 Do not resuscitate: Secondary | ICD-10-CM | POA: Diagnosis present

## 2023-07-05 DIAGNOSIS — R4182 Altered mental status, unspecified: Secondary | ICD-10-CM | POA: Diagnosis not present

## 2023-07-05 DIAGNOSIS — N390 Urinary tract infection, site not specified: Secondary | ICD-10-CM

## 2023-07-05 DIAGNOSIS — Z9181 History of falling: Secondary | ICD-10-CM | POA: Diagnosis not present

## 2023-07-05 DIAGNOSIS — F0390 Unspecified dementia without behavioral disturbance: Secondary | ICD-10-CM | POA: Diagnosis not present

## 2023-07-05 DIAGNOSIS — G309 Alzheimer's disease, unspecified: Secondary | ICD-10-CM | POA: Diagnosis present

## 2023-07-05 LAB — COMPREHENSIVE METABOLIC PANEL
ALT: 13 U/L (ref 0–44)
AST: 17 U/L (ref 15–41)
Albumin: 2.9 g/dL — ABNORMAL LOW (ref 3.5–5.0)
Alkaline Phosphatase: 73 U/L (ref 38–126)
Anion gap: 7 (ref 5–15)
BUN: 19 mg/dL (ref 8–23)
CO2: 25 mmol/L (ref 22–32)
Calcium: 8.4 mg/dL — ABNORMAL LOW (ref 8.9–10.3)
Chloride: 107 mmol/L (ref 98–111)
Creatinine, Ser: 1.12 mg/dL (ref 0.61–1.24)
GFR, Estimated: >60 mL/min (ref >60)
Glucose, Bld: 88 mg/dL (ref 70–99)
Potassium: 3.8 mmol/L (ref 3.5–5.1)
Sodium: 139 mmol/L (ref 135–145)
Total Bilirubin: 0.5 mg/dL (ref 0.3–1.2)
Total Protein: 6.6 g/dL (ref 6.5–8.1)

## 2023-07-05 LAB — CBC WITH DIFFERENTIAL/PLATELET
Abs Immature Granulocytes: 0.02 10*3/uL (ref 0.00–0.07)
Basophils Absolute: 0.1 10*3/uL (ref 0.0–0.1)
Basophils Relative: 1 %
Eosinophils Absolute: 0.1 10*3/uL (ref 0.0–0.5)
Eosinophils Relative: 1 %
HCT: 33.8 % — ABNORMAL LOW (ref 39.0–52.0)
Hemoglobin: 10.8 g/dL — ABNORMAL LOW (ref 13.0–17.0)
Immature Granulocytes: 0 %
Lymphocytes Relative: 13 %
Lymphs Abs: 1.1 10*3/uL (ref 0.7–4.0)
MCH: 29.8 pg (ref 26.0–34.0)
MCHC: 32 g/dL (ref 30.0–36.0)
MCV: 93.1 fL (ref 80.0–100.0)
Monocytes Absolute: 1 10*3/uL (ref 0.1–1.0)
Monocytes Relative: 11 %
Neutro Abs: 6.6 10*3/uL (ref 1.7–7.7)
Neutrophils Relative %: 74 %
Platelets: 230 10*3/uL (ref 150–400)
RBC: 3.63 MIL/uL — ABNORMAL LOW (ref 4.22–5.81)
RDW: 13.8 % (ref 11.5–15.5)
WBC: 8.9 10*3/uL (ref 4.0–10.5)
nRBC: 0 % (ref 0.0–0.2)

## 2023-07-05 LAB — URINALYSIS, W/ REFLEX TO CULTURE (INFECTION SUSPECTED)
Bacteria, UA: NONE SEEN
Bilirubin Urine: NEGATIVE
Glucose, UA: NEGATIVE mg/dL
Hgb urine dipstick: NEGATIVE
Ketones, ur: NEGATIVE mg/dL
Nitrite: NEGATIVE
Protein, ur: NEGATIVE mg/dL
Specific Gravity, Urine: 1.014 (ref 1.005–1.030)
pH: 6 (ref 5.0–8.0)

## 2023-07-05 LAB — LACTIC ACID, PLASMA
Lactic Acid, Venous: 1.1 mmol/L (ref 0.5–1.9)
Lactic Acid, Venous: 1.4 mmol/L (ref 0.5–1.9)

## 2023-07-05 LAB — SARS CORONAVIRUS 2 BY RT PCR: SARS Coronavirus 2 by RT PCR: NEGATIVE

## 2023-07-05 LAB — AMMONIA: Ammonia: 19 umol/L (ref 9–35)

## 2023-07-05 LAB — GLUCOSE, CAPILLARY: Glucose-Capillary: 131 mg/dL — ABNORMAL HIGH (ref 70–99)

## 2023-07-05 MED ORDER — LACTATED RINGERS IV BOLUS
1000.0000 mL | Freq: Once | INTRAVENOUS | Status: AC
Start: 2023-07-05 — End: 2023-07-05
  Administered 2023-07-05: 1000 mL via INTRAVENOUS

## 2023-07-05 MED ORDER — LACTATED RINGERS IV SOLN: INTRAVENOUS | Status: DC

## 2023-07-05 MED ORDER — ENOXAPARIN SODIUM 40 MG/0.4ML IJ SOSY
40.0000 mg | PREFILLED_SYRINGE | INTRAMUSCULAR | Status: DC
  Administered 2023-07-05 – 2023-07-11 (×7): 40 mg via SUBCUTANEOUS
  Filled 2023-07-05 (×7): qty 0.4

## 2023-07-05 MED ORDER — SODIUM CHLORIDE 0.9 % IV SOLN
1.0000 g | INTRAVENOUS | Status: DC
  Administered 2023-07-05 – 2023-07-06 (×2): 1 g via INTRAVENOUS
  Filled 2023-07-05 (×2): qty 10

## 2023-07-05 MED ORDER — HALOPERIDOL LACTATE 5 MG/ML IJ SOLN
2.0000 mg | Freq: Once | INTRAMUSCULAR | Status: AC
  Administered 2023-07-05: 2 mg via INTRAVENOUS
  Filled 2023-07-05: qty 1

## 2023-07-05 MED ORDER — IOHEXOL 350 MG/ML SOLN
75.0000 mL | Freq: Once | INTRAVENOUS | Status: AC | PRN
  Administered 2023-07-05: 75 mL via INTRAVENOUS

## 2023-07-05 NOTE — ED Notes (Signed)
Admitting team made aware of ready inpatient bed. Admitting states they will see pt upon admission to the floor.

## 2023-07-05 NOTE — Progress Notes (Signed)
EEG complete - results pending 

## 2023-07-05 NOTE — ED Triage Notes (Signed)
Pt comming in from snf. [facility]. Nurse on scene reports that pt is normally alert and oriented but starting two days ago pt had mental status change. Pt was tested at snf for uti. Pt is nonverbal right now.   Bp 140/80 Spo2 98% RR 20  HR 88 CBG 105

## 2023-07-05 NOTE — Progress Notes (Signed)
Patient arrived in the unit at 2025 pm from [facility]; Disoriented; agitated; intermittently apneic episode; came with 2L O2 ; initiated telemetry monitor; situated safety in a bed; and paged oncall provider to came and see pt; family member is on bedside; and will continue to monitor closely.

## 2023-07-05 NOTE — ED Notes (Signed)
ED TO INPATIENT HANDOFF REPORT  ED Nurse Name and Phone #: Morrie Sheldon RN [phone]  S Name/Age/Gender Alexander Bradley 82 y.o. male Room/Bed: 019C/019C  Code Status   Code Status: DNR  Home/SNF/Other Nursing Home Disoriented x4 Is this baseline? No   Triage Complete: Triage complete  Chief Complaint Acute encephalopathy [G93.40]  Triage Note Pt comming in from snf. [facility]. Nurse on scene reports that pt is normally alert and oriented but starting two days ago pt had mental status change. Pt was tested at snf for uti. Pt is nonverbal right now.   Bp 140/80 Spo2 98% RR 20  HR 88 CBG 105   Allergies Allergies  Allergen Reactions   Transderm-Scop [Scopolamine] Other (See Comments)    Unknown reaction Documented on MAR    Level of Care/Admitting Diagnosis ED Disposition     ED Disposition  Admit   Condition  --   Comment  Hospital Area: [facility] [[id number]]  Level of Care: Progressive [102]  Admit to Progressive based on following criteria: ACUTE MENTAL DISORDER-RELATED Drug/Alcohol Ingestion/Overdose/Withdrawal, Suicidal Ideation/attempt requiring safety sitter and < Q2h monitoring/assessments, moderate to severe agitation that is managed with medication/sitter, CIWA-Ar score < 20.  May admit patient to [facility] or [facility] if equivalent level of care is available:: No  Covid Evaluation: Confirmed COVID Negative  Diagnosis: Acute encephalopathy [478295]  Admitting Physician: Tyson Alias [[id number]]  Attending Physician: Tyson Alias [[id number]]  Certification:: I certify this patient will need inpatient services for at least 2 midnights  Estimated Length of Stay: 2          B Medical/Surgery History History reviewed. No pertinent past medical history. History reviewed. No pertinent surgical history.   A IV Location/Drains/Wounds Patient Lines/Drains/Airways Status     Active Line/Drains/Airways      Name Placement date Placement time Site Days   Peripheral IV 07/05/23 18 G Left Antecubital 07/05/23  1212  Antecubital  less than 1   Peripheral IV 07/05/23 20 G Anterior;Right Forearm 07/05/23  1403  Forearm  less than 1            Intake/Output Last 24 hours No intake or output data in the 24 hours ending 07/05/23 1720  Labs/Imaging Results for orders placed or performed during the hospital encounter of 07/05/23 (from the past 48 hour(s))  Urinalysis, w/ Reflex to Culture (Infection Suspected) -Urine, Unspecified Source     Status: Abnormal   Collection Time: 07/05/23 12:33 PM  Result Value Ref Range   Specimen Source URINE, UNSPE    Color, Urine YELLOW YELLOW   APPearance CLEAR CLEAR   Specific Gravity, Urine 1.014 1.005 - 1.030   pH 6.0 5.0 - 8.0   Glucose, UA NEGATIVE NEGATIVE mg/dL   Hgb urine dipstick NEGATIVE NEGATIVE   Bilirubin Urine NEGATIVE NEGATIVE   Ketones, ur NEGATIVE NEGATIVE mg/dL   Protein, ur NEGATIVE NEGATIVE mg/dL   Nitrite NEGATIVE NEGATIVE   Leukocytes,Ua LARGE (A) NEGATIVE   RBC / HPF 0-5 0 - 5 RBC/hpf   WBC, UA 21-50 0 - 5 WBC/hpf    Comment:        Reflex urine culture not performed if WBC <=10, OR if Squamous epithelial cells >5. If Squamous epithelial cells >5 suggest recollection.    Bacteria, UA NONE SEEN NONE SEEN   Squamous Epithelial / HPF 0-5 0 - 5 /HPF    Comment: Performed at [facility], [address], [place], [place]  [address]  Lactic acid, plasma     Status: None   Collection Time: 07/05/23 12:35 PM  Result Value Ref Range   Lactic Acid, Venous 1.1 0.5 - 1.9 mmol/L    Comment: Performed at [facility], [address] [place], [place] [address]  Comprehensive metabolic panel     Status: Abnormal   Collection Time: 07/05/23 12:35 PM  Result Value Ref Range   Sodium 139 135 - 145 mmol/L   Potassium 3.8 3.5 - 5.1 mmol/L   Chloride 107 98 - 111 mmol/L   CO2 25 22 - 32 mmol/L   Glucose, Bld 88 70 - 99 mg/dL     Comment: Glucose reference range applies only to samples taken after fasting for at least 8 hours.   BUN 19 8 - 23 mg/dL   Creatinine, Ser 0.98 0.61 - 1.24 mg/dL   Calcium 8.4 (L) 8.9 - 10.3 mg/dL   Total Protein 6.6 6.5 - 8.1 g/dL   Albumin 2.9 (L) 3.5 - 5.0 g/dL   AST 17 15 - 41 U/L   ALT 13 0 - 44 U/L   Alkaline Phosphatase 73 38 - 126 U/L   Total Bilirubin 0.5 0.3 - 1.2 mg/dL   GFR, Estimated >11 >91 mL/min    Comment: (NOTE) Calculated using the CKD-EPI Creatinine Equation (2021)    Anion gap 7 5 - 15    Comment: Performed at [facility], [address], [place], [place] [address]  CBC with Differential     Status: Abnormal   Collection Time: 07/05/23 12:35 PM  Result Value Ref Range   WBC 8.9 4.0 - 10.5 K/uL   RBC 3.63 (L) 4.22 - 5.81 MIL/uL   Hemoglobin 10.8 (L) 13.0 - 17.0 g/dL   HCT 56.2 (L) 13.0 - 86.5 %   MCV 93.1 80.0 - 100.0 fL   MCH 29.8 26.0 - 34.0 pg   MCHC 32.0 30.0 - 36.0 g/dL   RDW 78.4 69.6 - 29.5 %   Platelets 230 150 - 400 K/uL   nRBC 0.0 0.0 - 0.2 %   Neutrophils Relative % 74 %   Neutro Abs 6.6 1.7 - 7.7 K/uL   Lymphocytes Relative 13 %   Lymphs Abs 1.1 0.7 - 4.0 K/uL   Monocytes Relative 11 %   Monocytes Absolute 1.0 0.1 - 1.0 K/uL   Eosinophils Relative 1 %   Eosinophils Absolute 0.1 0.0 - 0.5 K/uL   Basophils Relative 1 %   Basophils Absolute 0.1 0.0 - 0.1 K/uL   Immature Granulocytes 0 %   Abs Immature Granulocytes 0.02 0.00 - 0.07 K/uL    Comment: Performed at [facility], [address], [place], [place] [address]  SARS Coronavirus 2 by RT PCR (hospital order, performed in [facility] hospital lab) *cepheid single result test* Urine, Unspecified Source     Status: None   Collection Time: 07/05/23  1:45 PM   Specimen: Urine, Unspecified Source; Nasal Swab  Result Value Ref Range   SARS Coronavirus 2 by RT PCR NEGATIVE NEGATIVE    Comment: Performed at [facility], [address], [place], [place] [address]  Lactic  acid, plasma     Status: None   Collection Time: 07/05/23  3:33 PM  Result Value Ref Range   Lactic Acid, Venous 1.4 0.5 - 1.9 mmol/L    Comment: Performed at [facility], [address], [place], [place] [address]  Ammonia  Status: None   Collection Time: 07/05/23  4:01 PM  Result Value Ref Range   Ammonia 19 9 - 35 umol/L    Comment: Performed at [facility], [address], [place], [place] [address]   CT ABDOMEN PELVIS W CONTRAST  Result Date: 07/05/2023 CLINICAL DATA:  Abdominal pain, altered mental status EXAM: CT ABDOMEN AND PELVIS WITH CONTRAST TECHNIQUE: Multidetector CT imaging of the abdomen and pelvis was performed using the standard protocol following bolus administration of intravenous contrast. RADIATION DOSE REDUCTION: This exam was performed according to the departmental dose-optimization program which includes automated exposure control, adjustment of the mA and/or kV according to patient size and/or use of iterative reconstruction technique. CONTRAST:  75mL OMNIPAQUE IOHEXOL 350 MG/ML SOLN COMPARISON:  08/08/2019 FINDINGS: Lower chest: Breathing motion limits evaluation of lung fields. Coronary artery calcifications are seen. There is ectasia aortic arch close to the origin of left subclavian. Calcifications are seen in thoracic aorta. Hepatobiliary: There is previous cholecystectomy. There is dilation of intrahepatic bile ducts. This finding has not changed significantly. Distal common bile duct in the head of the pancreas measures 9 x 7 mm. This lesion has been noted in previous examinations including MR abdomen done on 02/01/2017. Pancreas: There is 9 x 7 mm low-density lesion in the head of the pancreas which has not changed significantly. There is no dilation pancreatic duct. No new focal abnormalities are seen in the pancreas. Spleen: Unremarkable. Adrenals/Urinary Tract: Adrenals are unremarkable. Evaluation of kidneys is limited by motion artifacts. There is no  hydronephrosis. There are no renal or ureteral stones. There is mild diffuse wall thickening in the bladder. Stomach/Bowel: Stomach is moderately distended with fluid. Small bowel loops are not dilated. The appendix is not dilated. There is no significant wall thickening in colon. Scattered diverticula are seen in the colon without signs of focal acute diverticulitis. Vascular/Lymphatic: Scattered arterial calcifications are seen in aorta and its major branches. Reproductive: Prostate is enlarged. Other: There is no ascites or pneumoperitoneum. Musculoskeletal: There is deformity in the upper aspect of body of sternum suggesting possible old fracture. No acute findings are seen. Degenerative changes are noted in the lumbar spine with encroachment of neural foramina at multiple levels. IMPRESSION: There is no evidence of intestinal obstruction or pneumoperitoneum. There is no hydronephrosis. Appendix is not dilated. There is a 9 mm low-density lesion in the head of the pancreas with no significant change with change in comparison with previous studies dating as far back as 02/01/2017 suggesting benign process. There is prominence of intrahepatic and extrahepatic bile ducts with no significant change, possibly related to previous cholecystectomy. There is diffuse wall thickening in the urinary bladder which may be due to chronic outlet obstruction or chronic cystitis. Scattered diverticula are seen in the colon without signs of focal acute diverticulitis. Lumbar spondylosis. Aortic arteriosclerosis. Electronically Signed   By: Ernie Avena M.D.   On: 07/05/2023 15:30   CT HEAD WO CONTRAST ( )  Result Date: 07/05/2023 CLINICAL DATA:  Altered mental status EXAM: CT HEAD WITHOUT CONTRAST TECHNIQUE: Contiguous axial images were obtained from the base of the skull through the vertex without intravenous contrast. RADIATION DOSE REDUCTION: This exam was performed according to the departmental dose-optimization  program which includes automated exposure control, adjustment of the mA and/or kV according to patient size and/or use of iterative reconstruction technique. COMPARISON:  03/19/2022 FINDINGS: Brain: Motion artifacts are noted in many of the images. Additional images also show artifacts limiting the study. As  far as seen, no acute intracranial findings are noted. There are no signs of bleeding within the cranium. Cortical sulci are prominent. Ventricles are not dilated. Vascular: Unremarkable. Skull: No displaced fractures are seen. Sinuses/Orbits: Mucosal thickening is seen in maxillary sinuses. There are no air-fluid levels. Other: None. IMPRESSION: Motion limited study. As far as seen, no acute intracranial findings are noted. Cortical sulci are prominent suggesting atrophy. Chronic bilateral maxillary sinusitis. Electronically Signed   By: Ernie Avena M.D.   On: 07/05/2023 15:16   DG Chest Portable 1 View  Result Date: 07/05/2023 CLINICAL DATA:  Altered mental status EXAM: PORTABLE CHEST 1 VIEW COMPARISON:  12/16/2021 FINDINGS: Heart size is mildly enlarged. Aortic atherosclerosis. No focal airspace consolidation, pleural effusion, or pneumothorax. Lucency under the right hemidiaphragm with continuous diaphragm sign suspicious for pneumoperitoneum. IMPRESSION: 1. Findings suspicious for pneumoperitoneum. Recommend further evaluation with CT of the abdomen and pelvis. 2. No acute cardiopulmonary findings. These results were called by telephone at the time of interpretation on 07/05/2023 at 2:14 pm to provider [facility] , who verbally acknowledged these results. Electronically Signed   By: Duanne Guess D.O.   On: 07/05/2023 14:14    Pending Labs Unresulted Labs (From admission, onward)     Start     Ordered   07/06/23 0500  Basic metabolic panel  Tomorrow morning,   R        07/05/23 1629   07/06/23 0500  CBC  Tomorrow morning,   R        07/05/23 1629   07/05/23 1233  Culture, blood  (routine x 2)  BLOOD CULTURE X 2,   R (with STAT occurrences)      07/05/23 1233   07/05/23 1233  Urine Culture  Once,   R        07/05/23 1233            Vitals/Pain Today's Vitals   07/05/23 1530 07/05/23 1545 07/05/23 1640 07/05/23 1700  BP: (!) 131/98 (!) 144/97  (!) 156/92  Pulse: 72 91  81  Resp: 20 (!) 25  19  Temp:   98.4 F (36.9 C)   TempSrc:   Rectal   SpO2: 98% 97%  95%    Isolation Precautions No active isolations  Medications Medications  enoxaparin (LOVENOX) injection 40 mg (has no administration in time range)  cefTRIAXone (ROCEPHIN) 1 g in sodium chloride 0.9 % 100 mL IVPB (has no administration in time range)  lactated ringers infusion (has no administration in time range)  lactated ringers bolus 1,000 mL (0 mLs Intravenous Stopped 07/05/23 1536)  haloperidol lactate (HALDOL) injection 2 mg (2 mg Intravenous Given 07/05/23 1420)  iohexol (OMNIPAQUE) 350 MG/ML injection 75 mL (75 mLs Intravenous Contrast Given 07/05/23 1438)    Mobility non-ambulatory     Focused Assessments Neuro Assessment Handoff:  Swallow screen pass? No          Neuro Assessment: Exceptions to WDL Neuro Checks:      Has TPA been given? No If patient is a Neuro Trauma and patient is going to OR before floor call report to 4N Charge nurse: [phone] or [phone]   R Recommendations: See Admitting Provider Note  Report given to:   Additional Notes:

## 2023-07-05 NOTE — Hospital Course (Addendum)
Alexander Bradley is a 82 yo male with dementia coming in with AMS. Dehydrated, treated as outpatient for UTI.   Coming from nursing home ([name]- has been there 1.5 years). Son was with him 2 days ago, having conversations.  Normally needs help getting in wheelchair. Staff told son that this morning they were having trouble waking him up and he was unresponsive. Sunday was being tested for UTI   Was like this when he had covid.  Nurse told son that it looked like he was in pain.   Urination? Normally in adult diapers  PMHx: Alzheimers Dementia Hx of kidney stones   Meds: No med changes ** call [facility]   [facility] Losartan 15 mg every night Zoloft 25 mg pid Olmesartan 40 mg pid Norvasc 2.5 pid Senna bid Xarelto 20 mg pid Aspirin 81 Vit d3 1000 units Bactrim bid Atropine eyedrops  Social Hx: Son, Alexander Bradley is hPOA (DNR but ok w IVF, IV abx, hospitalization) Dependent of ADLs PCP: Eloisa Northern, MD   - NPO - IV fluids through night - eeg for choreiform movements  - MRI later - IV ceftriaxone - hold sedating medication, only give if safety issue   7/11: patient improving, still producing brown secretions in mouth; switch to Unasyn, repeat bcx if fevers, check procalcitonin, viral respiratory panel  --------------------------------- Alexander Bradley is a 82 yo male with dementia coming in with AMS. Dehydrated, treated as outpatient for UTI.   #Encephalopathy #Alzheimer's dementia Patient presented with acute AMS. He was nonverbal and unable to follow directions, and had jerky choreiform movements of his extremities. He had markedly dry mucous membranes and was started on LR IVF. CXR and CT head were unremarkable. CT abdomen/pelvis positive only for bladder wall thickening. EEG showed no seizure like activity. He had recently started tx for a UTI with Bactrim and was changed to ceftriaxone in the hospital until urine cultures came back negative. Blood  cultures also negative. Labs were unremarkable at presentation but patient became febrile and WBC count increased. He was at risk of aspiration and started treatment for aspiration pneumonia with Unasyn though CXR remained negative. Fever, WBC counts and mental status improved with rehydration and abx.  #Atrial fibrillation Patient has no documented history of afib in charts but was on Xarelto before admission. Plan to continue DOAC outpatient. Consider starting BB if tolerated--patient's lowest HR during hospital stay was 51.  #Normocytic anemia #Iron deficiency Patient has a mixed inflammatory anemia with iron deficient anemia with a serum iron of 10, TIBC of 186, and ferritin of 48. Lowest Hgb was 8.8 and has improved to 9.4.  7/15 Pt says "things were falling on the bed and I had to move" but he is not sure what they were. Got up in chair yesterday. Aware son was here yesterday. Says he has been eating very well.

## 2023-07-05 NOTE — ED Notes (Signed)
Pt having multiple shaking episodes and unable to get good pulse ox readings during episodes. Pt making gaspings noises with respirations since arrival to ED. Readings of 60% without accurate waveform during shaking episodes. Placed on 2L [place] due to not being able to get accurate reading. While lying still pulse ox 97% on 2LNC with accurate reading. Admitting made aware, advised they would come to assess the pt.

## 2023-07-05 NOTE — ED Notes (Signed)
Pt gone to CT 

## 2023-07-05 NOTE — Progress Notes (Signed)
Patient seen at bedside on 3L oxygen, son is present, who reports patient is having these "choking" episodes where he is gasping for air and jerking/choreiform movements. Patient has been altered for the past two days with sudden change in mental status. He has history of Alzheimer's that has been progressively worsening. Currently, patient has labored breathing, moments of gasping for air with jerking movements, that is consisted with the note earlier today. Son, at bedside, is informed about possibly using soft restraints in case for safety concerns or behavioral disturbances. We will avoid using any sedative medications.

## 2023-07-05 NOTE — ED Provider Notes (Addendum)
[name] Provider Note   CSN: [id number] Arrival date & time: 07/05/23  1210     History  Chief Complaint  Patient presents with   Altered Mental Status    Alexander Bradley is a 82 y.o. male.   Altered Mental Status Patient presents from nursing home.  Reportedly nurse on scene said patient is normally alert and oriented with no dementia but 2 days ago had mental status change.  Reportedly had urinalysis drawn but they did not know results.  Reviewing paperwork that comes with patient and will have access to here.  She does have a history of dementia.  Unsure of baseline however.  It appears patient was started on Bactrim for UTI yesterday.  Patient is now nonverbal for me.  Sitting in bed with eyes closed.  Will hold eyes closed.  Mouth is dry.  Will move extremities to pain but really does not have much improvement in mental status.  Feels warm but initially only axillary temperature had been done.  Has pressure sock on the left foot and ankle but not right.  No wounds seen.    History reviewed. No pertinent past medical history. Reported history of dementia.  History of DVT on Xarelto. Home Medications Prior to Admission medications   Medication Sig Start Date End Date Taking? Authorizing Provider  aspirin 81 MG chewable tablet Chew 81 mg by mouth daily.    [provider]  Calcium Carbonate-Vit D-Min (CALCIUM 600+D PLUS MINERALS) 600-400 MG-UNIT TABS Take 1 tablet by mouth daily.    [provider]  cyclobenzaprine (FLEXERIL) 5 MG tablet Take 5 mg by mouth 3 (three) times daily as needed for muscle spasms.    [provider]  Docosanol 10 % CREA Apply 1 application. topically 5 (five) times daily.    [provider]  furosemide (LASIX) 40 MG tablet Take 40 mg by mouth daily.    [provider]  hydrocortisone cream 1 % Apply 1 application. topically daily.    [provider]   memantine (NAMENDA) 10 MG tablet Take 0.5 tablet at bedtime for 7d, then 0.5 tablet twice daily for 7 days, then 1 tablet twice daily Patient not taking: Reported on 03/19/2022 10/04/18   [name], DO  olmesartan (BENICAR) 20 MG tablet Take 20 mg by mouth daily. 06/03/16   [provider]  pantoprazole (PROTONIX) 40 MG tablet Take 40 mg by mouth daily. 01/28/22   [provider]  rivaroxaban (XARELTO) 20 MG TABS tablet Take 20 mg by mouth as directed.    [provider]  senna (SENOKOT) 8.6 MG TABS tablet Take 1 tablet by mouth daily.    [provider]  sertraline (ZOLOFT) 50 MG tablet Take 75 mg by mouth daily. 08/24/18   [provider]      Allergies    Patient has no known allergies.    Review of Systems   Review of Systems  Physical Exam Updated Vital Signs BP (!) 183/99   Pulse 96   Temp 99.6 F (37.6 C) (Rectal)   Resp 16   SpO2 99%  Physical Exam Vitals reviewed.   Eyes held closed.  Decreased mental status.  Mild dry.  Will move some to pain.  Nonverbal.  No significant skin lesions seen.  I have seen some movement of all extremities although with tongue depressor placement posterior pharynx he does move his face around and does not appear to like  it but does not reach out to gravity.  Equal breath sounds bilaterally.  No abdominal tenderness or mass.  ED Results / Procedures / Treatments   Labs (all labs ordered are listed, but only abnormal results are displayed) Labs Reviewed  URINALYSIS, W/ REFLEX TO CULTURE (INFECTION SUSPECTED) - Abnormal; Notable for the following components:      Result Value   Leukocytes,Ua LARGE (*)    All other components within normal limits  COMPREHENSIVE METABOLIC PANEL - Abnormal; Notable for the following components:   Calcium 8.4 (*)    Albumin 2.9 (*)    All other components within normal limits  CBC WITH DIFFERENTIAL/PLATELET - Abnormal; Notable for the following components:   RBC 3.63  (*)    Hemoglobin 10.8 (*)    HCT 33.8 (*)    All other components within normal limits  SARS CORONAVIRUS 2 BY RT PCR  CULTURE, BLOOD (ROUTINE X 2)  CULTURE, BLOOD (ROUTINE X 2)  URINE CULTURE  LACTIC ACID, PLASMA  LACTIC ACID, PLASMA    EKG None  Radiology CT ABDOMEN PELVIS W CONTRAST  Result Date: 07/05/2023 CLINICAL DATA:  Abdominal pain, altered mental status EXAM: CT ABDOMEN AND PELVIS WITH CONTRAST TECHNIQUE: Multidetector CT imaging of the abdomen and pelvis was performed using the standard protocol following bolus administration of intravenous contrast. RADIATION DOSE REDUCTION: This exam was performed according to the departmental dose-optimization program which includes automated exposure control, adjustment of the mA and/or kV according to patient size and/or use of iterative reconstruction technique. CONTRAST:  75mL OMNIPAQUE IOHEXOL 350 MG/ML SOLN COMPARISON:  08/08/2019 FINDINGS: Lower chest: Breathing motion limits evaluation of lung fields. Coronary artery calcifications are seen. There is ectasia aortic arch close to the origin of left subclavian. Calcifications are seen in thoracic aorta. Hepatobiliary: There is previous cholecystectomy. There is dilation of intrahepatic bile ducts. This finding has not changed significantly. Distal common bile duct in the head of the pancreas measures 9 x 7 mm. This lesion has been noted in previous examinations including MR abdomen done on 02/01/2017. Pancreas: There is 9 x 7 mm low-density lesion in the head of the pancreas which has not changed significantly. There is no dilation pancreatic duct. No new focal abnormalities are seen in the pancreas. Spleen: Unremarkable. Adrenals/Urinary Tract: Adrenals are unremarkable. Evaluation of kidneys is limited by motion artifacts. There is no hydronephrosis. There are no renal or ureteral stones. There is mild diffuse wall thickening in the bladder. Stomach/Bowel: Stomach is moderately distended with  fluid. Small bowel loops are not dilated. The appendix is not dilated. There is no significant wall thickening in colon. Scattered diverticula are seen in the colon without signs of focal acute diverticulitis. Vascular/Lymphatic: Scattered arterial calcifications are seen in aorta and its major branches. Reproductive: Prostate is enlarged. Other: There is no ascites or pneumoperitoneum. Musculoskeletal: There is deformity in the upper aspect of body of sternum suggesting possible old fracture. No acute findings are seen. Degenerative changes are noted in the lumbar spine with encroachment of neural foramina at multiple levels. IMPRESSION: There is no evidence of intestinal obstruction or pneumoperitoneum. There is no hydronephrosis. Appendix is not dilated. There is a 9 mm low-density lesion in the head of the pancreas with no significant change with change in comparison with previous studies dating as far back as 02/01/2017 suggesting benign process. There is prominence of intrahepatic and extrahepatic bile ducts with no significant change, possibly related to previous cholecystectomy. There is diffuse wall thickening in the  urinary bladder which may be due to chronic outlet obstruction or chronic cystitis. Scattered diverticula are seen in the colon without signs of focal acute diverticulitis. Lumbar spondylosis. Aortic arteriosclerosis. Electronically Signed   By: Ernie Avena M.D.   On: 07/05/2023 15:30   CT HEAD WO CONTRAST ( )  Result Date: 07/05/2023 CLINICAL DATA:  Altered mental status EXAM: CT HEAD WITHOUT CONTRAST TECHNIQUE: Contiguous axial images were obtained from the base of the skull through the vertex without intravenous contrast. RADIATION DOSE REDUCTION: This exam was performed according to the departmental dose-optimization program which includes automated exposure control, adjustment of the mA and/or kV according to patient size and/or use of iterative reconstruction technique.  COMPARISON:  03/19/2022 FINDINGS: Brain: Motion artifacts are noted in many of the images. Additional images also show artifacts limiting the study. As far as seen, no acute intracranial findings are noted. There are no signs of bleeding within the cranium. Cortical sulci are prominent. Ventricles are not dilated. Vascular: Unremarkable. Skull: No displaced fractures are seen. Sinuses/Orbits: Mucosal thickening is seen in maxillary sinuses. There are no air-fluid levels. Other: None. IMPRESSION: Motion limited study. As far as seen, no acute intracranial findings are noted. Cortical sulci are prominent suggesting atrophy. Chronic bilateral maxillary sinusitis. Electronically Signed   By: Ernie Avena M.D.   On: 07/05/2023 15:16   DG Chest Portable 1 View  Result Date: 07/05/2023 CLINICAL DATA:  Altered mental status EXAM: PORTABLE CHEST 1 VIEW COMPARISON:  12/16/2021 FINDINGS: Heart size is mildly enlarged. Aortic atherosclerosis. No focal airspace consolidation, pleural effusion, or pneumothorax. Lucency under the right hemidiaphragm with continuous diaphragm sign suspicious for pneumoperitoneum. IMPRESSION: 1. Findings suspicious for pneumoperitoneum. Recommend further evaluation with CT of the abdomen and pelvis. 2. No acute cardiopulmonary findings. These results were called by telephone at the time of interpretation on 07/05/2023 at 2:14 pm to provider [facility] , who verbally acknowledged these results. Electronically Signed   By: Duanne Guess D.O.   On: 07/05/2023 14:14    Procedures Procedures    Medications Ordered in ED Medications  lactated ringers bolus 1,000 mL (0 mLs Intravenous Stopped 07/05/23 1536)  haloperidol lactate (HALDOL) injection 2 mg (2 mg Intravenous Given 07/05/23 1420)  iohexol (OMNIPAQUE) 350 MG/ML injection 75 mL (75 mLs Intravenous Contrast Given 07/05/23 1438)    ED Course/ Medical Decision Making/ A&P                             Medical Decision  Making Amount and/or Complexity of Data Reviewed Labs: ordered. Radiology: ordered.  Risk Prescription drug management.   Patient with mental change.  Feels warm I think likely to find infection.  Has been treated for UTI.  Also likely dehydration with mouth is dry as it is.  Appears patient is a DNR.  Will get basic blood work including blood cultures and lactate.  Will get head CT and chest x-ray.  Will get urinalysis.  Will check rectal temperature.  Rectal temperature was only 99.6.  Blood work overall reassuring.  Urine Checked and Has Leukocytes and WBCs but No Bacteria.  Initial Lactic Acid Normal.  White Count Is 8.9. CMP over all reassuring.  Initial chest x-ray showed potential free air under the diaphragm.  Discussed with radiologist.  Also independently interpreted the x-ray.  Code medicine activated and CT done urgently.  CT scan done and does not show obstruction.  Potentially chronic bladder outlet obstruction versus UTI.  Urine does have white cells but no bacteria, however patient is already been on Bactrim.  I think with mental status change at this level patient require admission to the hospital.  CRITICAL CARE Performed by: Benjiman Core Total critical care time: 30 minutes Critical care time was exclusive of separately billable procedures and treating other patients. Critical care was necessary to treat or prevent imminent or life-threatening deterioration. Critical care was time spent personally by me on the following activities: development of treatment plan with patient and/or surrogate as well as nursing, discussions with consultants, evaluation of patient's response to treatment, examination of patient, obtaining history from patient or surrogate, ordering and performing treatments and interventions, ordering and review of laboratory studies, ordering and review of radiographic studies, pulse oximetry and re-evaluation of patient's condition.  Patient's son is  now here.  Has mental status at baseline that is conversive but does get altered at times.  Was able to go out with him in a wheelchair a few days ago.          Final Clinical Impression(s) / ED Diagnoses Final diagnoses:  Encephalopathy acute  Dehydration    Rx / DC Orders ED Discharge Orders     None         Benjiman Core, MD 07/05/23 1541    Benjiman Core, MD 07/05/23 1549

## 2023-07-05 NOTE — H&P (Addendum)
Date: 07/05/2023               Patient Name:  Alexander Bradley MRN: [id number]  DOB: 03-05-1941 Age / Sex: 82 y.o., male   PCP: Eloisa Northern, MD         Medical Service: Internal Medicine Teaching Service         Attending Physician: Dr. 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 He lives at [facility] nursing home and the nurses reported that he started having changes in mental status and was evaluated for a UTI. He is reported to have started treatment for a UTI with Bactrim yesterday. This morning, the nurses were having trouble waking him up and patient was unresponsive and appeared to be in pain. Son reports that he was with him 2 days ago and he was at his baseline mental status and was conversive. Son reports that patient has had similar symptoms in the past when he had Covid.   Meds:  Attempted to call [facility] to review current medications, but unable to reach anyone.  Medications currently listed in chart include: Aspirin 81 mg daily Flexeril 5 mg TID PRN Lasix 40 mg daily Olmesartan 20 mg daily Pantoprazole 40 mg daily Sertraline 75 mg daily   PMH Alzheimers Dementia Hx of kidney stones  Social:  Lives at [facility] nursing home Level of Function: Dependent of ADLs Support: Nursing home, son Alexander Bradley is HCPOA PCP: Eloisa Northern, MD  Family History:  Not reviewed  Allergies: Allergies as of 07/05/2023   (No Known Allergies)    Review of Systems: A complete ROS was negative except as per HPI.   OBJECTIVE:   Physical Exam:  Blood pressure (!)  144/97, pulse 91, temperature 99.6 F (37.6 C), temperature source Rectal, resp. rate (!) 25, SpO2 97 %.  Constitutional: uncomfortable appearing HEENT: normocephalic atraumatic, mucus membranes dry Cardiovascular: regular rate and rhythm, no m/r/g Pulmonary/Chest: labored work of breathing on room air, upper airway noises on auscultation Abdominal: soft, non-tender, non-distended Neurological: GCS 13: eyes open, no verbal response, withdraws from pain; jerking/choreiform extremity movements Skin: warm and dry  Labs: CBC    Component Value Date/Time   WBC 8.9 07/05/2023 1235   RBC 3.63 (L) 07/05/2023 1235   HGB 10.8 (L) 07/05/2023 1235   HCT 33.8 (L) 07/05/2023 1235   PLT 230 07/05/2023 1235   MCV 93.1 07/05/2023 1235   MCH 29.8 07/05/2023 1235   MCHC 32.0 07/05/2023 1235   RDW 13.8 07/05/2023 1235   LYMPHSABS 1.1 07/05/2023 1235   MONOABS 1.0 07/05/2023 1235   EOSABS 0.1 07/05/2023 1235   BASOSABS 0.1 07/05/2023 1235     CMP     Component Value Date/Time   NA 139 07/05/2023 1235   K 3.8 07/05/2023 1235   CL 107 07/05/2023 1235   CO2 25 07/05/2023 1235   GLUCOSE 88 07/05/2023 1235   BUN 19 07/05/2023 1235   CREATININE 1.12 07/05/2023 1235   CALCIUM 8.4 (L) 07/05/2023 1235   PROT 6.6 07/05/2023 1235   ALBUMIN 2.9 (  L) 07/05/2023 1235   AST 17 07/05/2023 1235   ALT 13 07/05/2023 1235   ALKPHOS 73 07/05/2023 1235   BILITOT 0.5 07/05/2023 1235   GFRNONAA >60 07/05/2023 1235    Imaging: CT ABDOMEN PELVIS W CONTRAST  Result Date: 07/05/2023 CLINICAL DATA:  Abdominal pain, altered mental status EXAM: CT ABDOMEN AND PELVIS WITH CONTRAST TECHNIQUE: Multidetector CT imaging of the abdomen and pelvis was performed using the standard protocol following bolus administration of intravenous contrast. RADIATION DOSE REDUCTION: This exam was performed according to the departmental dose-optimization program which includes automated exposure control, adjustment of the mA and/or kV  according to patient size and/or use of iterative reconstruction technique. CONTRAST:  75mL OMNIPAQUE IOHEXOL 350 MG/ML SOLN COMPARISON:  08/08/2019 FINDINGS: Lower chest: Breathing motion limits evaluation of lung fields. Coronary artery calcifications are seen. There is ectasia aortic arch close to the origin of left subclavian. Calcifications are seen in thoracic aorta. Hepatobiliary: There is previous cholecystectomy. There is dilation of intrahepatic bile ducts. This finding has not changed significantly. Distal common bile duct in the head of the pancreas measures 9 x 7 mm. This lesion has been noted in previous examinations including MR abdomen done on 02/01/2017. Pancreas: There is 9 x 7 mm low-density lesion in the head of the pancreas which has not changed significantly. There is no dilation pancreatic duct. No new focal abnormalities are seen in the pancreas. Spleen: Unremarkable. Adrenals/Urinary Tract: Adrenals are unremarkable. Evaluation of kidneys is limited by motion artifacts. There is no hydronephrosis. There are no renal or ureteral stones. There is mild diffuse wall thickening in the bladder. Stomach/Bowel: Stomach is moderately distended with fluid. Small bowel loops are not dilated. The appendix is not dilated. There is no significant wall thickening in colon. Scattered diverticula are seen in the colon without signs of focal acute diverticulitis. Vascular/Lymphatic: Scattered arterial calcifications are seen in aorta and its major branches. Reproductive: Prostate is enlarged. Other: There is no ascites or pneumoperitoneum. Musculoskeletal: There is deformity in the upper aspect of body of sternum suggesting possible old fracture. No acute findings are seen. Degenerative changes are noted in the lumbar spine with encroachment of neural foramina at multiple levels. IMPRESSION: There is no evidence of intestinal obstruction or pneumoperitoneum. There is no hydronephrosis. Appendix is not  dilated. There is a 9 mm low-density lesion in the head of the pancreas with no significant change with change in comparison with previous studies dating as far back as 02/01/2017 suggesting benign process. There is prominence of intrahepatic and extrahepatic bile ducts with no significant change, possibly related to previous cholecystectomy. There is diffuse wall thickening in the urinary bladder which may be due to chronic outlet obstruction or chronic cystitis. Scattered diverticula are seen in the colon without signs of focal acute diverticulitis. Lumbar spondylosis. Aortic arteriosclerosis. Electronically Signed   By: Ernie Avena M.D.   On: 07/05/2023 15:30   CT HEAD WO CONTRAST ( )  Result Date: 07/05/2023 CLINICAL DATA:  Altered mental status EXAM: CT HEAD WITHOUT CONTRAST TECHNIQUE: Contiguous axial images were obtained from the base of the skull through the vertex without intravenous contrast. RADIATION DOSE REDUCTION: This exam was performed according to the departmental dose-optimization program which includes automated exposure control, adjustment of the mA and/or kV according to patient size and/or use of iterative reconstruction technique. COMPARISON:  03/19/2022 FINDINGS: Brain: Motion artifacts are noted in many of the images. Additional images also show artifacts limiting the study. As far as seen, no acute  intracranial findings are noted. There are no signs of bleeding within the cranium. Cortical sulci are prominent. Ventricles are not dilated. Vascular: Unremarkable. Skull: No displaced fractures are seen. Sinuses/Orbits: Mucosal thickening is seen in maxillary sinuses. There are no air-fluid levels. Other: None. IMPRESSION: Motion limited study. As far as seen, no acute intracranial findings are noted. Cortical sulci are prominent suggesting atrophy. Chronic bilateral maxillary sinusitis. Electronically Signed   By: Ernie Avena M.D.   On: 07/05/2023 15:16   DG Chest  Portable 1 View  Result Date: 07/05/2023 CLINICAL DATA:  Altered mental status EXAM: PORTABLE CHEST 1 VIEW COMPARISON:  12/16/2021 FINDINGS: Heart size is mildly enlarged. Aortic atherosclerosis. No focal airspace consolidation, pleural effusion, or pneumothorax. Lucency under the right hemidiaphragm with continuous diaphragm sign suspicious for pneumoperitoneum. IMPRESSION: 1. Findings suspicious for pneumoperitoneum. Recommend further evaluation with CT of the abdomen and pelvis. 2. No acute cardiopulmonary findings. These results were called by telephone at the time of interpretation on 07/05/2023 at 2:14 pm to provider [facility] , who verbally acknowledged these results. Electronically Signed   By: Duanne Guess D.O.   On: 07/05/2023 14:14    EKG: personally reviewed my interpretation is normal sinus rhythm.   ASSESSMENT & PLAN:    Assessment & Plan by Problem: Principal Problem:   Acute encephalopathy   Kendry Ferrand is a 82 yo male with Alzheimer's dementia who presented to the ED and admitted for altered mental status.  #Acute encephalopathy #Alzheimer's Dementia Patient has very acute AMS unlikely to be caused by worsening Alzheimer's dementia. Unremarkable CMP makes metabolic causes unlikely. CT showed no acute intracranial findings. Patient cannot tolerate MRI at this time due to inability to follow direction or remain still, but would ideally obtain MRI to rule out structural etiology of his AMS. CXR without obvious signs of infx, but urinalysis does have large leukocytes and getting urine culture. Patient is afebrile with normal WBC count but cannot rule out infection at this time. Starting on ceftriaxone now to cover for possible UTI. Patient appears dry and could have delirium in the setting of dehydration. Started 100 cc/hour LR. Bladder scan showed ~250 mL retained urine but he had an in and out cath earlier today. EEG ordered to see if any seizure activity. Can  also consider LP for possible meningitis/encephalitis. Placing on telemetry overnight. Will evaluate changes in mental status and follow labs. -LR 100 cc/hour -EEG -Will attempt to obtain MRI tomorrow  -CBC -BMP -Follow up blood and urine culture -IV Ceftriaxone 1 g q24h -Telemetry -Hold all sedating medications unless there is a safety risk -Could use risperidone 0.5 mg for safety concern/behavior disturbance -NPO pending improvement in mental status  Diet: NPO VTE: Enoxaparin IVF: LR,100cc/hr Code: DNR  Prior to Admission Living Arrangement:  [facility] Anticipated Discharge Location: SNF Barriers to Discharge: AMS  Dispo: Admit patient to Inpatient with expected length of stay greater than 2 midnights.  Signed: Barrett Shell, Medical Student  07/05/2023, 4:35 PM    Attestation for Student Documentation:  I personally was present and performed or re-performed the history, physical exam and medical decision-making activities of this service and have verified that the service and findings are accurately documented in the student's note.  Chauncey Mann, DO 07/05/2023, 6:39 PM

## 2023-07-06 DIAGNOSIS — G9340 Encephalopathy, unspecified: Secondary | ICD-10-CM | POA: Diagnosis not present

## 2023-07-06 DIAGNOSIS — R4182 Altered mental status, unspecified: Secondary | ICD-10-CM

## 2023-07-06 LAB — CBC
HCT: 29.8 % — ABNORMAL LOW (ref 39.0–52.0)
Hemoglobin: 9.7 g/dL — ABNORMAL LOW (ref 13.0–17.0)
MCH: 30.4 pg (ref 26.0–34.0)
MCHC: 32.6 g/dL (ref 30.0–36.0)
MCV: 93.4 fL (ref 80.0–100.0)
Platelets: 201 10*3/uL (ref 150–400)
RBC: 3.19 MIL/uL — ABNORMAL LOW (ref 4.22–5.81)
RDW: 13.7 % (ref 11.5–15.5)
WBC: 12 10*3/uL — ABNORMAL HIGH (ref 4.0–10.5)
nRBC: 0 % (ref 0.0–0.2)

## 2023-07-06 LAB — BASIC METABOLIC PANEL
Anion gap: 7 (ref 5–15)
BUN: 22 mg/dL (ref 8–23)
CO2: 22 mmol/L (ref 22–32)
Calcium: 8 mg/dL — ABNORMAL LOW (ref 8.9–10.3)
Chloride: 109 mmol/L (ref 98–111)
Creatinine, Ser: 1.11 mg/dL (ref 0.61–1.24)
GFR, Estimated: >60 mL/min (ref >60)
Glucose, Bld: 122 mg/dL — ABNORMAL HIGH (ref 70–99)
Potassium: 3.7 mmol/L (ref 3.5–5.1)
Sodium: 138 mmol/L (ref 135–145)

## 2023-07-06 LAB — URINE CULTURE: Culture: NO GROWTH

## 2023-07-06 LAB — MRSA NEXT GEN BY PCR, NASAL: MRSA by PCR Next Gen: NOT DETECTED

## 2023-07-06 MED ORDER — SODIUM CHLORIDE 3 % IN NEBU
4.0000 mL | INHALATION_SOLUTION | Freq: Every day | RESPIRATORY_TRACT | Status: DC
  Filled 2023-07-06: qty 4

## 2023-07-06 MED ORDER — ORAL CARE MOUTH RINSE
15.0000 mL | OROMUCOSAL | Status: DC
  Administered 2023-07-06 – 2023-07-11 (×20): 15 mL via OROMUCOSAL

## 2023-07-06 MED ORDER — ORAL CARE MOUTH RINSE: 15.0000 mL | OROMUCOSAL | Status: DC | PRN

## 2023-07-06 MED ORDER — LACTATED RINGERS IV SOLN: INTRAVENOUS | Status: DC

## 2023-07-06 NOTE — Progress Notes (Signed)
Rolled patient to clean soiled linen from BM, as patient was lying on R side, patient coughed up thin dark brown liquid, patient agitated, incomprehensible speech, no changes in assessment. MD notified.

## 2023-07-06 NOTE — Progress Notes (Signed)
HD#1 SUBJECTIVE:  Patient Summary: Alexander Bradley is a 82 y.o. with a pertinent PMH of Alzheimer's dementia, who presented with and was admitted for altered mental status.   Overnight Events:  Patient continues to have agonal and noisy breathing and jerking extremity movements. Nurses had difficulty getting an O2 saturation because of these movements and so they started 2L O2 via nasal cannula.   Interm History:  Patient is altered and nonverbal. Son came later in the day and says that he thinks patient looks better than he did last night and thinks it could be related to him being in a room now instead of the ED. Discussed plan with son and he is agreeable.  OBJECTIVE:  Vital Signs: Vitals:   07/05/23 2340 07/06/23 0339 07/06/23 0721 07/06/23 1153  BP: 133/76 (!) 169/75 (!) 124/57 121/66  Pulse: 70 70 62 68  Resp:   16 16  Temp: 98 F (36.7 C) 98.1 F (36.7 C) 98.4 F (36.9 C) 99.6 F (37.6 C)  TempSrc: Axillary Axillary Axillary Axillary  SpO2: 98% 98% 99% 98%   Supplemental O2: Nasal Cannula SpO2: 98 % O2 Flow Rate (L/min): 3 L/min  No intake or output data in the 24 hours ending 07/06/23 1524 Net IO Since Admission: No IO data has been entered for this period [07/06/23 1524]  Physical Exam: Gen: laying in bed, not alert, unable to follow directions, looks uncomfortable but improved HEENT: normocephalic, atraumatic, eyes open, mucus membranes dry CV: RRR, no MRG Pulm: increased WOB, upper airway noises on auscultation Ab: normoactive bowel sounds, no tenderness or masses Ext: no lower extremity edema   Patient Lines/Drains/Airways Status     Active Line/Drains/Airways     Name Placement date Placement time Site Days   Peripheral IV 07/05/23 18 G Left Antecubital 07/05/23  1212  Antecubital  1   Peripheral IV 07/05/23 20 G Anterior;Right Forearm 07/05/23  1403  Forearm  1            Pertinent Labs:    Latest Ref Rng & Units 07/06/2023    2:41  AM 07/05/2023   12:35 PM 02/24/2022    1:18 PM  CBC  WBC 4.0 - 10.5 K/uL 12.0  8.9  7.0   Hemoglobin 13.0 - 17.0 g/dL 9.7  98.1  19.1   Hematocrit 39.0 - 52.0 % 29.8  33.8  36.0   Platelets 150 - 400 K/uL 201  230  296        Latest Ref Rng & Units 07/06/2023    2:41 AM 07/05/2023   12:35 PM 02/24/2022    1:18 PM  CMP  Glucose 70 - 99 mg/dL 478  88  90   BUN 8 - 23 mg/dL 22  19  16    Creatinine 0.61 - 1.24 mg/dL 2.95  6.21  3.08   Sodium 135 - 145 mmol/L 138  139  139   Potassium 3.5 - 5.1 mmol/L 3.7  3.8  3.8   Chloride 98 - 111 mmol/L 109  107  105   CO2 22 - 32 mmol/L 22  25  26    Calcium 8.9 - 10.3 mg/dL 8.0  8.4  8.8   Total Protein 6.5 - 8.1 g/dL  6.6  7.4   Total Bilirubin 0.3 - 1.2 mg/dL  0.5  1.2   Alkaline Phos 38 - 126 U/L  73  76   AST 15 - 41 U/L  17  19   ALT 0 -  44 U/L  13  15     Recent Labs    07/05/23 2037  GLUCAP 131*     Pertinent Imaging: EEG adult  Result Date: 07/06/2023 Charlsie Quest, MD     07/06/2023  7:54 AM Patient Name: Alexander Bradley MRN: [id number] Epilepsy Attending: Charlsie Quest Referring Physician/Provider: Chauncey Mann, DO Date: 07/05/2023 Duration: 26.50 mins Patient history: 82yo M with ams getting eeg to evaluate for seizure. Level of alertness: Awake AEDs during EEG study: None Technical aspects: This EEG study was done with scalp electrodes positioned according to the 10-20 International system of electrode placement. Electrical activity was reviewed with band pass filter of 1-70Hz , sensitivity of 7 uV/mm, display speed of 42mm/sec with a 60Hz  notched filter applied as appropriate. EEG data were recorded continuously and digitally stored.  Video monitoring was available and reviewed as appropriate. Description: No clear posterior dominant rhythm was seen. EEG showed continuous generalized 3 to 6 Hz theta-delta slowing. Hyperventilation and photic stimulation were not performed.   ABNORMALITY - Continuous slow, generalized  IMPRESSION: This study is suggestive of moderate diffuse encephalopathy, nonspecific etiology. No seizures or epileptiform discharges were seen throughout the recording. Charlsie Quest    ASSESSMENT/PLAN:  Assessment: Principal Problem:   Acute encephalopathy Active Problems:   Atrial flutter [facility])   Essential hypertension   Vascular dementia without behavioral disturbance (HCC)    Alexander Bradley is a 82 y.o. with a pertinent PMH of Alzheimer's dementia, who presented with and was admitted for altered mental status on hospital day 1  Plan: #Encephalopathy Patient is still unresponsive but his breathing seems improved and his jerking extremity movements have decreased. Imaging has been unrevealing for causes of this acute change in mental status. EEG showed no seizure activity. Reflex urine culture grew no organisms. Preliminary results from blood culture have grown no organisms. BMP is unremarkable and CBC showed slightly increased WBC to 12.0. Patient's last temp is 99.6. The etiology of this acute encephalopathy is still unclear. Unlikely to be metabolic or medication related. No obvious signs of infx but treating with IV ceftriaxone 1 g/24 hours until infx ruled out. Possible other causes include bladder outlet obstruction, ischemic causes, or dehydration. Giving LR 125 cc/hour until well hydrated on exam. -LR 125 cc/hour -IV ceftriaxone 1 g/24 hour -Bladder scans -CBC -BMP -Telemetry  Best Practice: Diet: NPO IVF: Fluids: LR, Rate: 125 cc/hour VTE: enoxaparin (LOVENOX) injection 40 mg Start: 07/05/23 1630 Code: DNR AB: IV ceftriaxone 1 g every 24 hours Therapy Recs:  not assessed , DME: other not assessed Family Contact: Delaine Lame, at bedside. DISPO: Anticipated discharge  TBD   Signature: Randell Patient, Medical Student   Please contact the on call pager after 5 pm and on weekends at [phone].

## 2023-07-06 NOTE — Plan of Care (Signed)

## 2023-07-06 NOTE — Procedures (Signed)
Patient Name: Alexander Bradley  MRN: [id number]  Epilepsy Attending: Charlsie Quest  Referring Physician/Provider: Chauncey Mann, DO  Date: 07/05/2023 Duration: 26.50 mins  Patient history: 82yo M with ams getting eeg to evaluate for seizure.   Level of alertness: Awake  AEDs during EEG study: None  Technical aspects: This EEG study was done with scalp electrodes positioned according to the 10-20 International system of electrode placement. Electrical activity was reviewed with band pass filter of 1-70Hz , sensitivity of 7 uV/mm, display speed of 44mm/sec with a 60Hz  notched filter applied as appropriate. EEG data were recorded continuously and digitally stored.  Video monitoring was available and reviewed as appropriate.  Description: No clear posterior dominant rhythm was seen. EEG showed continuous generalized 3 to 6 Hz theta-delta slowing. Hyperventilation and photic stimulation were not performed.     ABNORMALITY - Continuous slow, generalized  IMPRESSION: This study is suggestive of moderate diffuse encephalopathy, nonspecific etiology. No seizures or epileptiform discharges were seen throughout the recording.  Alexander Bradley

## 2023-07-06 NOTE — Progress Notes (Signed)
   07/06/23 1950  Therapy Vitals  Pulse Rate 66  Resp (!) 22  Patient Position (if appropriate) Lying  Respiratory Assessment  Assessment Type Assess only  Respiratory Pattern Regular;Unlabored  Chest Assessment Chest expansion symmetrical  Cough Weak;Congested;Productive  Sputum Amount Moderate  Sputum Color Tan  Sputum Consistency Thick  Sputum Specimen Source Nasal tracheal  Bilateral Breath Sounds Diminished;Rhonchi  Oxygen Therapy/Pulse Ox  O2 Device (S)  HFNC (salter)  O2 Therapy Oxygen humidified  O2 Flow Rate (L/min) 10 L/min  SpO2 98 %   Called to patient's room for respiratory distress. According to RN the patient's oxygen saturation went down to the mid 80's. RN placed the patient on a NRB. O2 sat then became low 90's. Pt diminished and rhonchi. Pt NTS with moderate amount of secretions. Pt also orally suctioned with a moderate amount of sputum. Pt O2 at 96% on salter at 6L. Will continue to monitor.

## 2023-07-06 NOTE — Progress Notes (Signed)
Entered patient's room to administer medications, patient had white frothy sputum around mouth. Performed oral suction, patient still alert, restless, MD notified

## 2023-07-07 ENCOUNTER — Inpatient Hospital Stay (HOSPITAL_COMMUNITY): Payer: Medicare Other

## 2023-07-07 DIAGNOSIS — I4891 Unspecified atrial fibrillation: Secondary | ICD-10-CM

## 2023-07-07 DIAGNOSIS — D649 Anemia, unspecified: Secondary | ICD-10-CM | POA: Diagnosis not present

## 2023-07-07 DIAGNOSIS — G9340 Encephalopathy, unspecified: Secondary | ICD-10-CM | POA: Diagnosis not present

## 2023-07-07 DIAGNOSIS — E871 Hypo-osmolality and hyponatremia: Secondary | ICD-10-CM | POA: Diagnosis not present

## 2023-07-07 LAB — RESPIRATORY PANEL BY PCR

## 2023-07-07 LAB — GLUCOSE, CAPILLARY
Glucose-Capillary: 113 mg/dL — ABNORMAL HIGH (ref 70–99)
Glucose-Capillary: 115 mg/dL — ABNORMAL HIGH (ref 70–99)

## 2023-07-07 LAB — CBC
HCT: 27.9 % — ABNORMAL LOW (ref 39.0–52.0)
Hemoglobin: 9 g/dL — ABNORMAL LOW (ref 13.0–17.0)
MCH: 29.9 pg (ref 26.0–34.0)
MCHC: 32.3 g/dL (ref 30.0–36.0)
MCV: 92.7 fL (ref 80.0–100.0)
Platelets: 184 10*3/uL (ref 150–400)
RBC: 3.01 MIL/uL — ABNORMAL LOW (ref 4.22–5.81)
RDW: 14.4 % (ref 11.5–15.5)
WBC: 18 10*3/uL — ABNORMAL HIGH (ref 4.0–10.5)
nRBC: 0 % (ref 0.0–0.2)

## 2023-07-07 LAB — BASIC METABOLIC PANEL
Anion gap: 10 (ref 5–15)
BUN: 43 mg/dL — ABNORMAL HIGH (ref 8–23)
CO2: 26 mmol/L (ref 22–32)
Calcium: 8.4 mg/dL — ABNORMAL LOW (ref 8.9–10.3)
Chloride: 111 mmol/L (ref 98–111)
Creatinine, Ser: 1.28 mg/dL — ABNORMAL HIGH (ref 0.61–1.24)
GFR, Estimated: 55 mL/min — ABNORMAL LOW (ref >60)
Glucose, Bld: 117 mg/dL — ABNORMAL HIGH (ref 70–99)
Potassium: 3.2 mmol/L — ABNORMAL LOW (ref 3.5–5.1)
Sodium: 147 mmol/L — ABNORMAL HIGH (ref 135–145)

## 2023-07-07 LAB — PROCALCITONIN: Procalcitonin: 0.26 ng/mL

## 2023-07-07 MED ORDER — SODIUM CHLORIDE 0.9 % IV SOLN
1.5000 g | Freq: Four times a day (QID) | INTRAVENOUS | Status: DC
  Administered 2023-07-07 – 2023-07-11 (×17): 1.5 g via INTRAVENOUS
  Filled 2023-07-07 (×20): qty 4

## 2023-07-07 MED ORDER — SODIUM CHLORIDE 3 % IN NEBU
4.0000 mL | INHALATION_SOLUTION | Freq: Every day | RESPIRATORY_TRACT | Status: AC
  Administered 2023-07-07: 4 mL via RESPIRATORY_TRACT
  Filled 2023-07-07: qty 4

## 2023-07-07 MED ORDER — DEXTROSE-SODIUM CHLORIDE 5-0.45 % IV SOLN
INTRAVENOUS | Status: AC
  Administered 2023-07-07 – 2023-07-08 (×3): 125 mL/h via INTRAVENOUS

## 2023-07-07 MED ORDER — POTASSIUM CHLORIDE 10 MEQ/100ML IV SOLN
10.0000 meq | INTRAVENOUS | Status: AC
  Administered 2023-07-07 (×4): 10 meq via INTRAVENOUS
  Filled 2023-07-07 (×4): qty 100

## 2023-07-07 MED ORDER — ALBUTEROL SULFATE (2.5 MG/3ML) 0.083% IN NEBU
2.5000 mg | INHALATION_SOLUTION | RESPIRATORY_TRACT | Status: DC | PRN
  Administered 2023-07-07: 2.5 mg via RESPIRATORY_TRACT
  Filled 2023-07-07: qty 3

## 2023-07-07 NOTE — Progress Notes (Signed)
Pharmacy Antibiotic Note  Alexander Bradley is a 82 y.o. male admitted on 07/05/2023 with suspicion and radiographic evidence of aspiration pneumonia. Pharmacy has been consulted for Unasyn (ampicillin-sulbactam) dosing.  Plan: Unasyn 1.5 grams, IV every six hours.     Temp (24hrs), Avg:99.4 F (37.4 C), Min:97.4 F (36.3 C), Max:100.3 F (37.9 C)  Recent Labs  Lab 07/05/23 1235 07/05/23 1533 07/06/23 0241 07/07/23 1059  WBC 8.9  --  12.0* 18.0*  CREATININE 1.12  --  1.11 1.28*  LATICACIDVEN 1.1 1.4  --   --     CrCl cannot be calculated (Unknown ideal weight.).    Allergies  Allergen Reactions   Transderm-Scop [Scopolamine] Other (See Comments)    Unknown reaction Documented on [facility]    Microbiology results: 7/9 BCx right hand: No growth 2 days on 7/11 7/9 BCx: right forearm: No growth 2 days on 7/11 7/9/ UCx: No growth, final report on 7/10 7/11 BCx--repeated>>    Thank you for allowing pharmacy to be a part of this patient's care.  Elicia Lamp, PHarmD, CPP 07/07/2023 12:24 PM

## 2023-07-07 NOTE — Progress Notes (Addendum)
HD#2 SUBJECTIVE:  Patient Summary: Alexander Bradley is a 82 y.o. with a pertinent PMH of dementia, who presented with and was admitted for altered mental status.   Overnight Events:  Nurses noted that patient was producing tan frothy fluid out of his mouth and spO2 desatted to mid 80s%. He was suctioned and started on HFNC at 9 L.   Interm History:  Patient was nonverbal but mumbling when seen this morning and could not provide subjective. Upon return, patient was asked if he was in pain and he said no. Son was updated on the plan and was agreeable.   OBJECTIVE:  Vital Signs: Vitals:   07/07/23 0520 07/07/23 0716 07/07/23 0753 07/07/23 0900  BP:  121/66    Pulse: 93 94    Resp: (!) 24 (!) 25  18  Temp:  99.8 F (37.7 C)    TempSrc:  Axillary    SpO2: 93% 91% 92%    Supplemental O2: Nasal Cannula SpO2: 92 % O2 Flow Rate (L/min): 9 L/min   Intake/Output Summary (Last 24 hours) at 07/07/2023 1031 Last data filed at 07/07/2023 1610 Gross per 24 hour  Intake 0 ml  Output 775 ml  Net -775 ml   Net IO Since Admission: -775 mL [07/07/23 1031]  Physical Exam: Gen: laying in bed, alert and tracking, in no acute distress, mumbling but nonverbal HEENT: normocephalic, atraumatic CV: RRR, no MRG Pulm: normal WOB through mouth, clear to auscultation in bilateral lower lobes Ab: no tenderness or masses Ext: no lower extremity edema   Patient Lines/Drains/Airways Status     Active Line/Drains/Airways     Name Placement date Placement time Site Days   Peripheral IV 07/05/23 20 G Anterior;Right Forearm 07/05/23  1403  Forearm  2   External Urinary Catheter 07/06/23  0700  --  1            Pertinent Labs:    Latest Ref Rng & Units 07/06/2023    2:41 AM 07/05/2023   12:35 PM 02/24/2022    1:18 PM  CBC  WBC 4.0 - 10.5 K/uL 12.0  8.9  7.0   Hemoglobin 13.0 - 17.0 g/dL 9.7  96.0  45.4   Hematocrit 39.0 - 52.0 % 29.8  33.8  36.0   Platelets 150 - 400 K/uL 201  230   296        Latest Ref Rng & Units 07/06/2023    2:41 AM 07/05/2023   12:35 PM 02/24/2022    1:18 PM  CMP  Glucose 70 - 99 mg/dL 098  88  90   BUN 8 - 23 mg/dL 22  19  16    Creatinine 0.61 - 1.24 mg/dL 1.19  1.47  8.29   Sodium 135 - 145 mmol/L 138  139  139   Potassium 3.5 - 5.1 mmol/L 3.7  3.8  3.8   Chloride 98 - 111 mmol/L 109  107  105   CO2 22 - 32 mmol/L 22  25  26    Calcium 8.9 - 10.3 mg/dL 8.0  8.4  8.8   Total Protein 6.5 - 8.1 g/dL  6.6  7.4   Total Bilirubin 0.3 - 1.2 mg/dL  0.5  1.2   Alkaline Phos 38 - 126 U/L  73  76   AST 15 - 41 U/L  17  19   ALT 0 - 44 U/L  13  15     Recent Labs    07/05/23 2037  GLUCAP 131*     Pertinent Imaging: DG CHEST PORT 1 VIEW  Result Date: 07/07/2023 CLINICAL DATA:  [id number]. Aspiration pneumonia. Respiratory distress. EXAM: PORTABLE CHEST 1 VIEW COMPARISON:  Portable chest 07/05/2023 FINDINGS: The lungs are clear. No pleural effusion is seen. There is mild cardiomegaly without evidence of CHF. There is aortic tortuosity and atherosclerosis with stable mediastinum. Multiple overlying monitor wires. No acute skeletal findings. IMPRESSION: 1. No evidence of acute chest disease. 2. Aortic atherosclerosis and uncoiling. 3. Mild cardiomegaly. Electronically Signed   By: Almira Bar M.D.   On: 07/07/2023 06:21    ASSESSMENT/PLAN:  Assessment: Principal Problem:   Acute encephalopathy Active Problems:   Atrial flutter [facility])   Essential hypertension   Vascular dementia without behavioral disturbance (HCC)   Alexander Bradley is a 82 y.o. with pertinent PMH of dementia who was admitted for AMS on hospital day 2  Plan: #Acute Encephalopathy Patient's mental status is improving. He seems more alert and tracking with his eyes. He was able to respond when asked if he was in pain. The cause of his acute encephalopathy is still unknown. He is febrile with WBC count of 18 pointing to infectious causes. Urine culture was negative and CXR  was unremarkable. He is now on 9 L HFNC due to oxygen sats dropping to 80s overnight, now stable. Viral respiratory panel was negative. Repeating blood cultures today. Procalcitonin level was 0.26. He is high risk for aspiration pneumonia so stopping ceftriaxone and starting Unasyn. Will continue to rehydrate with D5 0.45% NaCl as he is still dry and he is improving as fluid status is restored. Continue bladder scans to evaluate overflow/obstruction as possible cause. -Start Unasyn -Repeat blood cultures. -Procalcitonin -Bladder scans -Mobilize patient -D5 0.45 NaCl -Repeat BMP and CBC in am  #Hypernatremia BMP today showed sodium of 147. Switched LR to D5 0.45 NaCl.  #Atrial fibrillation Patient is hemodynamically stable and rate is well controlled, last HR of 76. No history of afib seen in charts or offered by patient's son. He was taking xarelto prior to admission but was not on a BB or NDHP. Restart Xarelto once patient can tolerate PO.  #Normocytic Anemia Patient's Hgb is now 9.0, down from 9.7 yesterday. MCV normal and stable at 92.7. Will continue to monitor anemia with CBC. No signs of bleeding at this time. -CBC  Best Practice: Diet: NPO IVF: Fluids: D5 0.45% NaCl, Rate:  125 cc/hour VTE: enoxaparin (LOVENOX) injection 40 mg Start: 07/05/23 1630 Code: DNR AB: Unasyn Therapy Recs:  not evaluated , DME: not evaluated Family Contact: Delaine Lame, at bedside. DISPO: Anticipated discharge  TBD  to Skilled nursing facility pending  Improved mental status .  Signature: Randell Patient, Medical Student   Please contact the on call pager after 5 pm and on weekends at [phone].

## 2023-07-07 NOTE — Progress Notes (Signed)
Pt continues to have congestion and difficulty moving secretions. NTS performed with moderate amount of secretions. Pt still has rhonchi breath sounds at this time.   07/07/23 0231  Therapy Vitals  Pulse Rate 70  Resp (!) 23  MEWS Score/Color  MEWS Score 1  MEWS Score Color Green  Respiratory Assessment  Assessment Type Assess only  Respiratory Pattern Regular;Unlabored  Chest Assessment Chest expansion symmetrical  Cough Weak;Congested  Sputum Amount Moderate  Sputum Color Tan  Sputum Consistency Thick  Sputum Specimen Source Nasal tracheal  Bilateral Breath Sounds Diminished;Rhonchi  Oxygen Therapy/Pulse Ox  O2 Device HFNC  O2 Therapy Oxygen humidified  O2 Flow Rate (L/min) 6 L/min  SpO2 95 %

## 2023-07-07 NOTE — Plan of Care (Signed)

## 2023-07-07 NOTE — Progress Notes (Addendum)
Paged by Nurse to bedside concerns for frothy sputum secretions and congestion. RN noted patient is on HFNS saturating at 96%. Initially, we spoke with the Son over the phone acknowledging his concerns about his father with regards to secretions and congestion. At bedside, patient was alert and sitting in bed. Son at bedside, mentioned that prior to leaving this afternoon roughly around 12:30 PM, patient had no secretions and appeared improving in terms of mentation. However, when he returned at 19:00, patient was expelling tan-brown frothy sputum and gurgling. Son notes change in patient presentation compared to the morning. Patient is currently on 6L HFNC with tan-brown frothy secretions concerning for aspiration pneumonia. Lung exam was positive for crackles in the upper lobes. He is currently afebrile.   Per chart review pt was switched from [place] to HFNC around 2100 as he was desaturating to the mid 80s. Given new oxygen requirement, will order CXR to evaluate for aspiration pneumonia, as well as obtain sputum samples. Will also try nebulizer form of hypertonic saline for symptom relief, while manually aspirating out secretions.   Further discussions with son revealed that he is interested in pursuing comfort care as an option, and states his main goal is for his father to be as comfortable as possible if this is a life threatening condition. He is open to palliative care discussions.

## 2023-07-07 NOTE — Progress Notes (Signed)
Pt increased to 9L due to O2 desaturation. Pt NTS. Moderate amount of thick, tan secretions removed from nasal tracheal and oral. MD's in the room. Pt continues to have rhonchorus breath sounds.   07/07/23 0520  Oxygen Therapy/Pulse Ox  O2 Device HFNC  O2 Therapy Oxygen humidified  O2 Flow Rate (L/min) 9 L/min  SpO2 93 %

## 2023-07-07 NOTE — Progress Notes (Signed)
Called to bedside by RN. Patient desatted to low 80s and required 9L HFNC. At bedside, RN and respiratory therapist present. RN states that whenever patient desaturates, his O2 decreases to low 80s, and patient has copious tan-brown secretions coming out of his mouth. This requires increasing his oxygen requirement from 6L to 9L to maintain adequate saturation. Currently, Vitals are 128/78 (89), 95 bpm, 9L HFNC saturating at 93% post suctioning. Of note, still waiting on CXR to be done. We just ordered another STAT CXR to evaluate for mucous plugging and possible aspiration pneumonia. Patient was evaluated in the presence of my senior, Dr. Thomasene Ripple.

## 2023-07-07 NOTE — Progress Notes (Addendum)
Dr. Thomasene Ripple was made aware several times during the night that pt was desatting to 84-85%; pt had brown frothy, foam, secretions spewing from his mouth, pt sound gurgly, coarse, and congested. Pt was orally suctioned numerous times during the night. Pt was placed on HFNC at 6 liters initial than had to be increased to oxygen 9L HFNC (See EPIC for more details). Dr. Thomasene Ripple was made aware that pt had a tempt of 100.3 F axillary and that I was unable to obtain a sputum sample from pt due to pt's inability to cough.Pt was bathed with complete linen change twice during the night. Respiratory therapy was consulted for nasopharyngeal suctioning during the night.  CXR was done. The pt's son spoke with the doctor last night.

## 2023-07-08 DIAGNOSIS — D649 Anemia, unspecified: Secondary | ICD-10-CM | POA: Diagnosis not present

## 2023-07-08 DIAGNOSIS — E875 Hyperkalemia: Secondary | ICD-10-CM

## 2023-07-08 DIAGNOSIS — J690 Pneumonitis due to inhalation of food and vomit: Secondary | ICD-10-CM | POA: Diagnosis not present

## 2023-07-08 DIAGNOSIS — G9340 Encephalopathy, unspecified: Secondary | ICD-10-CM | POA: Diagnosis not present

## 2023-07-08 LAB — BASIC METABOLIC PANEL
Anion gap: 4 — ABNORMAL LOW (ref 5–15)
BUN: 29 mg/dL — ABNORMAL HIGH (ref 8–23)
CO2: 22 mmol/L (ref 22–32)
Calcium: 7.8 mg/dL — ABNORMAL LOW (ref 8.9–10.3)
Chloride: 116 mmol/L — ABNORMAL HIGH (ref 98–111)
Creatinine, Ser: 1.01 mg/dL (ref 0.61–1.24)
GFR, Estimated: >60 mL/min (ref >60)
Glucose, Bld: 132 mg/dL — ABNORMAL HIGH (ref 70–99)
Potassium: 3.2 mmol/L — ABNORMAL LOW (ref 3.5–5.1)
Sodium: 142 mmol/L (ref 135–145)

## 2023-07-08 LAB — CULTURE, BLOOD (ROUTINE X 2): Culture: NO GROWTH

## 2023-07-08 LAB — GLUCOSE, CAPILLARY
Glucose-Capillary: 115 mg/dL — ABNORMAL HIGH (ref 70–99)
Glucose-Capillary: 117 mg/dL — ABNORMAL HIGH (ref 70–99)
Glucose-Capillary: 101 mg/dL — ABNORMAL HIGH (ref 70–99)
Glucose-Capillary: 120 mg/dL — ABNORMAL HIGH (ref 70–99)

## 2023-07-08 LAB — CBC
HCT: 27.3 % — ABNORMAL LOW (ref 39.0–52.0)
Hemoglobin: 8.8 g/dL — ABNORMAL LOW (ref 13.0–17.0)
MCH: 29.6 pg (ref 26.0–34.0)
MCHC: 32.2 g/dL (ref 30.0–36.0)
MCV: 91.9 fL (ref 80.0–100.0)
Platelets: 171 10*3/uL (ref 150–400)
RBC: 2.97 MIL/uL — ABNORMAL LOW (ref 4.22–5.81)
RDW: 14.5 % (ref 11.5–15.5)
WBC: 13.3 10*3/uL — ABNORMAL HIGH (ref 4.0–10.5)
nRBC: 0 % (ref 0.0–0.2)

## 2023-07-08 LAB — RETIC PANEL
Immature Retic Fract: 12.5 % (ref 2.3–15.9)
RBC.: 2.93 MIL/uL — ABNORMAL LOW (ref 4.22–5.81)
Retic Count, Absolute: 26.1 10*3/uL (ref 19.0–186.0)
Retic Ct Pct: 0.9 % (ref 0.4–3.1)
Reticulocyte Hemoglobin: 31.4 pg (ref >27.9)

## 2023-07-08 LAB — IRON AND TIBC
Iron: 10 ug/dL — ABNORMAL LOW (ref 45–182)
Saturation Ratios: 5 % — ABNORMAL LOW (ref 17.9–39.5)
TIBC: 186 ug/dL — ABNORMAL LOW (ref 250–450)
UIBC: 176 ug/dL

## 2023-07-08 LAB — FERRITIN: Ferritin: 48 ng/mL (ref 24–336)

## 2023-07-08 MED ORDER — POTASSIUM CHLORIDE 10 MEQ/100ML IV SOLN
10.0000 meq | INTRAVENOUS | Status: AC
  Administered 2023-07-08 (×4): 10 meq via INTRAVENOUS
  Filled 2023-07-08 (×4): qty 100

## 2023-07-08 NOTE — Evaluation (Signed)
Occupational Therapy Evaluation Patient Details Name: Alexander Bradley MRN: [id number] DOB: 14-Dec-1941 Today's Date: 07/08/2023   History of Present Illness Pt is an 82 y/o male presenting with acute encephalopathy. PMH: dementia, kidney stones   Clinical Impression   PTA, pt from [facility] LTC, unsure of assist required at SNF d/t pt speech and cognitive deficits. Pt pleasant and able to follow commands consistently. Overall, Mod-Max A x 2 required for bed mobility and Max A x 2 for standing trials with RW. Pt with heavy posterior bias in standing and unable to successfully take steps today. Anticipate pt will do well standing with Stedy. Pt requires up to Mod A for UB ADL and Total A for LB ADLs. Pt with productive cough during session and able to be titrated to 2 L O2 with SpO2 > 95%. Patient will benefit from continued inpatient follow up therapy, <3 hours/day at his LTC.      Recommendations for follow up therapy are one component of a multi-disciplinary discharge planning process, led by the attending physician.  Recommendations may be updated based on patient status, additional functional criteria and insurance authorization.   Assistance Recommended at Discharge Frequent or constant Supervision/Assistance  Patient can return home with the following Two people to help with walking and/or transfers;Two people to help with bathing/dressing/bathroom    Functional Status Assessment  Patient has had a recent decline in their functional status and demonstrates the ability to make significant improvements in function in a reasonable and predictable amount of time.  Equipment Recommendations  None recommended by OT    Recommendations for Other Services       Precautions / Restrictions Precautions Precautions: Fall;Other (comment) Precaution Comments: monitor O2 Restrictions Weight Bearing Restrictions: No      Mobility Bed Mobility Overal bed mobility: Needs Assistance Bed  Mobility: Supine to Sit, Sit to Supine     Supine to sit: Mod assist, +2 for physical assistance, +2 for safety/equipment Sit to supine: Max assist, +2 for physical assistance, +2 for safety/equipment   General bed mobility comments: Pt able to assist with LE to EOB and multimodal cues with moderate assist to lift trunk. Max A x 2 required to return to supine as pt attempting to lay back horizontally w/ posterior bias    Transfers Overall transfer level: Needs assistance Equipment used: Rolling walker (2 wheels) Transfers: Sit to/from Stand Sit to Stand: Max assist, +2 physical assistance, +2 safety/equipment           General transfer comment: Max A x 2 with cues/assist to lean forward, noted heavy bracing of BLE against bed w/ B foot blocking needed to prevent sliding due to posterior bias. Pt attempting to step/correct posture though Max A needed for balance-  unable to progress away from bed      Balance Overall balance assessment: Needs assistance Sitting-balance support: No upper extremity supported, Feet supported Sitting balance-Leahy Scale: Poor Sitting balance - Comments: Initial min A w/ posterior bias progressing to close supervision > 5 min Postural control: Posterior lean Standing balance support: Bilateral upper extremity supported, During functional activity Standing balance-Leahy Scale: Zero Standing balance comment: Max A for standing balance + 2 due to strong posterior bias                           ADL either performed or assessed with clinical judgement   ADL Overall ADL's : Needs assistance/impaired Eating/Feeding: NPO   Grooming: Moderate assistance;Bed  level;Wash/dry face Grooming Details (indicate cue type and reason): attempting to use B UE for task. unable to lift UE to face, requiring support at elbow to wipe eyes. some incoordination noted Upper Body Bathing: Moderate assistance;Sitting   Lower Body Bathing: Maximal assistance;+2 for  physical assistance;+2 for safety/equipment;Sitting/lateral leans;Sit to/from stand   Upper Body Dressing : Moderate assistance;Sitting   Lower Body Dressing: Maximal assistance;+2 for physical assistance;+2 for safety/equipment;Sitting/lateral leans;Sit to/from stand       Toileting- Architect and Hygiene: Total assistance         General ADL Comments: Significant posterior bias in standing and limitations due to cognition/impaired speech     Vision Ability to See in Adequate Light: 1 Impaired Patient Visual Report: Other (comment) (to be further assessed) Vision Assessment?: Vision impaired- to be further tested in functional context Additional Comments: to be further assessed as cognition clears     Perception     Praxis      Pertinent Vitals/Pain Pain Assessment Pain Assessment: Faces Faces Pain Scale: No hurt Pain Intervention(s): Monitored during session     Hand Dominance Right   Extremity/Trunk Assessment Upper Extremity Assessment Upper Extremity Assessment: Generalized weakness;Difficult to assess due to impaired cognition   Lower Extremity Assessment Lower Extremity Assessment: Defer to PT evaluation   Cervical / Trunk Assessment Cervical / Trunk Assessment: Kyphotic   Communication Communication Communication: Expressive difficulties;Other (comment) (low volume, slurred words)   Cognition Arousal/Alertness: Awake/alert Behavior During Therapy: WFL for tasks assessed/performed Overall Cognitive Status: History of cognitive impairments - at baseline                                 General Comments: hx of dementia; difficult to fully assess due to impaired speech. appeared to say he was at [facility] currently when asking orientation questions. does follow commands w/ multimodal cues; does attempt to respond to conversation     General Comments  On 9 L O2 HFNC on entry though cannula out of nose during ADLs bed level. SpO2 low  90s on RA bed level, 89% once sitting EOB - replaced with 2 L O2 and pt sustained 95% throughout session    Exercises     Shoulder Instructions      Home Living Family/patient expects to be discharged to:: Skilled nursing facility                                 Additional Comments: from [facility] per chart      Prior Functioning/Environment Prior Level of Function : Patient poor historian/Family not available             Mobility Comments: pt says "yes" when asked if he uses a RW; due to intelligible speech, unsure if pt ambulatory with RW or uses for transfers only ADLs Comments: Unsure d/t AMS and intelligible speech        OT Problem List: Decreased strength;Decreased activity tolerance;Impaired balance (sitting and/or standing);Decreased cognition;Decreased coordination;Decreased safety awareness;Decreased knowledge of use of DME or AE;Cardiopulmonary status limiting activity      OT Treatment/Interventions: Self-care/ADL training;Therapeutic exercise;Energy conservation;DME and/or AE instruction;Therapeutic activities;Patient/family education    OT Goals(Current goals can be found in the care plan section) Acute Rehab OT Goals Patient Stated Goal: agreeable to sit EOB OT Goal Formulation: With patient Time For Goal Achievement: 07/22/23 Potential to Achieve Goals: Fair ADL  Goals Pt Will Perform Grooming: with min guard assist;sitting Pt Will Perform Upper Body Bathing: with min guard assist;sitting Pt Will Transfer to Toilet: with mod assist;stand pivot transfer;bedside commode Additional ADL Goal #1: Pt to complete bed mobility with Mod A in prep for ADL tasks  OT Frequency: Min 2X/week    Co-evaluation PT/OT/SLP Co-Evaluation/Treatment: Yes Reason for Co-Treatment: For patient/therapist safety   OT goals addressed during session: ADL's and self-care      AM-PAC OT "6 Clicks" Daily Activity     Outcome Measure Help from another person eating  meals?: Total Help from another person taking care of personal grooming?: A Little Help from another person toileting, which includes using toliet, bedpan, or urinal?: Total Help from another person bathing (including washing, rinsing, drying)?: A Lot Help from another person to put on and taking off regular upper body clothing?: A Lot Help from another person to put on and taking off regular lower body clothing?: Total 6 Click Score: 10   End of Session Equipment Utilized During Treatment: Gait belt;Rolling walker (2 wheels);Oxygen Nurse Communication: Mobility status  Activity Tolerance: Patient tolerated treatment well Patient left: in bed;with call bell/phone within reach;with bed alarm set  OT Visit Diagnosis: Unsteadiness on feet (R26.81);Other abnormalities of gait and mobility (R26.89);Muscle weakness (generalized) (M62.81)                Time: 6213-0865 OT Time Calculation (min): 30 min Charges:  OT General Charges $OT Visit: 1 Visit OT Evaluation $OT Eval Moderate Complexity: 1 Mod  Bradd Canary, OTR/L Acute Rehab Services Office: [phone]   Lorre Munroe 07/08/2023, 11:28 AM

## 2023-07-08 NOTE — Care Management Important Message (Signed)
Important Message  Patient Details  Name: Gurjeet Mavity MRN: [id number] Date of Birth: Jan 16, 1941   Medicare Important Message Given:  Yes     Sherilyn Banker 07/08/2023, 3:54 PM

## 2023-07-08 NOTE — Progress Notes (Signed)
HD#3 SUBJECTIVE:  Patient Summary: Alexander Bradley is a 82 y.o. with a pertinent PMH of dementia and admitted for declining mental status.   Overnight Events: NAEO  Interim History: Patient was evaluated at bedside. His encephalopathy is improved.  He is able to answer questions and move his extremities upon command.  He tolerated drinking from a cup of water with a straw.  He denies any pain or other concerns.  OBJECTIVE:  Vital Signs: Vitals:   07/07/23 1700 07/07/23 1936 07/07/23 2312 07/08/23 0300  BP:  (!) 141/72 (!) 104/92   Pulse:  (!) 103 84 91  Resp: (!) 23 (!) 25 18   Temp:  98.7 F (37.1 C) 98.3 F (36.8 C) 99 F (37.2 C)  TempSrc:  Axillary Oral Axillary  SpO2:  100% 100% 94%   Supplemental O2: Nasal Cannula SpO2: 94 % O2 Flow Rate (L/min): 9 L/min  There were no vitals filed for this visit.   Intake/Output Summary (Last 24 hours) at 07/08/2023 0726 Last data filed at 07/08/2023 0417 Gross per 24 hour  Intake 5491.58 ml  Output 800 ml  Net 4691.58 ml   Net IO Since Admission: 3,916.58 mL [07/08/23 0726]  CBC    Component Value Date/Time   WBC 13.3 (H) 07/08/2023 0644   RBC 2.97 (L) 07/08/2023 0644   RBC 2.93 (L) 07/08/2023 0644   HGB 8.8 (L) 07/08/2023 0644   HCT 27.3 (L) 07/08/2023 0644   PLT 171 07/08/2023 0644   MCV 91.9 07/08/2023 0644   MCH 29.6 07/08/2023 0644   MCHC 32.2 07/08/2023 0644   RDW 14.5 07/08/2023 0644   LYMPHSABS 1.1 07/05/2023 1235   MONOABS 1.0 07/05/2023 1235   EOSABS 0.1 07/05/2023 1235   BASOSABS 0.1 07/05/2023 1235    Physical Exam: Physical Exam Constitutional:      Appearance: Normal appearance.  HENT:     Head: Normocephalic and atraumatic.  Cardiovascular:     Rate and Rhythm: Normal rate and regular rhythm.  Pulmonary:     Effort: Pulmonary effort is normal.  Skin:    General: Skin is warm.  Neurological:     General: No focal deficit present.     Mental Status: He is alert.     Comments:  Patient could move extremities upon command.  Psychiatric:     Comments: Confused but knew he was in the hospital but did not know why     ASSESSMENT/PLAN:  Assessment: Principal Problem:   Acute encephalopathy Active Problems:   Atrial flutter (HCC)   Essential hypertension   Vascular dementia without behavioral disturbance (HCC)  Plan: #Acute encephalopathy #Aspiration pneumonia Patient's encephalopathy improved today.  His is more alert and could move his extremities upon command.  His cultures remain negative for growth.  His procalcitonin was normal.  Respiratory panel was negative.  His white blood cell count decreased to 13.3 and he has remained afebrile.  He was able to tolerate fluids with a straw.  He was titrated to 2 L of O2 with O2 saturation greater than 95 while working with OT.  He was also able to participate in therapy with PT. -Continue Unasyn -Blood cultures -Trend CBC -Continue work with PT/OT -Continue work with SLP  #Hyperkalemia Potassium remains decreased at 3.2. -Replenished with KCl 10 M EQ x 4  #Normocytic anemia Hemoglobin decreased to 8.8 today.  Iron is decreased at 10, TIBC is decreased at 186, saturation is decreased at 5%.  Ferritin is normal at 48.  His reticulocyte index is 0.39. -Holding iron as patient is currently being treated for an aspiration pneumonia -Trend CBC  Best Practice: Diet: N.p.o. VTE: enoxaparin (LOVENOX) injection 40 mg Start: 07/05/23 1630 Code: DNR AB: Unasyn Family Contact: Son, [name] DISPO: Anticipated discharge to Skilled nursing facility pending resolution of encephalopathy and tolerability of oral feeds.  Signature: Morrie Sheldon, MD Internal Medicine Resident, PGY-1 [facility]  Pager: [phone]  Please contact the on call pager after 5 pm and on weekends at [phone].

## 2023-07-08 NOTE — Evaluation (Signed)
Clinical/Bedside Swallow Evaluation Patient Details  Name: Alexander Bradley MRN: [id number] Date of Birth: [birth year]  Today's Date: 07/08/2023 Time: SLP Start Time (ACUTE ONLY): 1400 SLP Stop Time (ACUTE ONLY): 1417 SLP Time Calculation (min) (ACUTE ONLY): 17 min  Past Medical History: History reviewed. No pertinent past medical history. Past Surgical History: History reviewed. No pertinent surgical history. HPI:  Alexander Bradley is a 82 y.o. with a pertinent PMH of dementia, who presented with and was admitted for altered mental status. Suspected to have aspiration pna, one veiw CXRs have been clear. Has been observed to produce tan frothy fluid out of his mouth with spO2 desat to mid 80s%. Family had reported a desire for comfort measures. On 7/12 pt more responsive and sipping water. SLP asked to evaluate.    Assessment / Plan / Recommendation  Clinical Impression  Pt is alert but confused, dysarthria and dysphonic. Eager to eat and drink but needs assist with self feeding. Progressed from thin to nectar to honey to puree with delayed congested coughing after each bolus. Engaged son in conversation about plan of care, son interested in preventing any further decline now that pt has improved since admission. We agreed risk of aspiration would be low with honey thick liquids via teaspoons with aspiration precautions and it may keep pt calm since he is at risk of increased delirium with prolonged NPO status. Son hopeful for MBS this weekend to determine if PO intake is safe and if diet can be advanced without significant risk. Will attempt to schedule but availability often limited on the weekend.  SLP Visit Diagnosis: Dysphagia, oropharyngeal phase (R13.12)    Aspiration Risk  Moderate aspiration risk    Diet Recommendation Honey-thick liquid    Liquid Administration via: Spoon;Cup Medication Administration: Crushed with puree Supervision: Full supervision/cueing for  compensatory strategies Compensations: Slow rate;Small sips/bites Postural Changes: Seated upright at 90 degrees    Other  Recommendations Oral Care Recommendations: Oral care BID    Recommendations for follow up therapy are one component of a multi-disciplinary discharge planning process, led by the attending physician.  Recommendations may be updated based on patient status, additional functional criteria and insurance authorization.  Follow up Recommendations Skilled nursing-short term rehab (<3 hours/day)      Assistance Recommended at Discharge    Functional Status Assessment Patient has had a recent decline in their functional status and demonstrates the ability to make significant improvements in function in a reasonable and predictable amount of time.  Frequency and Duration min 2x/week  2 weeks       Prognosis Prognosis for improved oropharyngeal function: Fair Barriers to Reach Goals: Cognitive deficits      Swallow Study   General HPI: Alexander Bradley is a 82 y.o. with a pertinent PMH of dementia, who presented with and was admitted for altered mental status. Suspected to have aspiration pna, one veiw CXRs have been clear. Has been observed to produce tan frothy fluid out of his mouth with spO2 desat to mid 80s%. Family had reported a desire for comfort measures. On 7/12 pt more responsive and sipping water. SLP asked to evaluate. Type of Study: Bedside Swallow Evaluation Previous Swallow Assessment: none Diet Prior to this Study: NPO Respiratory Status: Nasal cannula History of Recent Intubation: No Behavior/Cognition: Alert;Cooperative;Pleasant mood Oral Cavity Assessment: Within Functional Limits Oral Care Completed by SLP: No Oral Cavity - Dentition: Adequate natural dentition Vision: Functional for self-feeding Self-Feeding Abilities: Needs assist Patient Positioning: Upright in bed Baseline  Vocal Quality: Breathy;Low vocal intensity;Hoarse Volitional  Cough: Congested Volitional Swallow: Able to elicit    Oral/Motor/Sensory Function Overall Oral Motor/Sensory Function: Within functional limits   Ice Chips     Thin Liquid Thin Liquid: Impaired Presentation: Straw Oral Phase Functional Implications: Left anterior spillage;Right anterior spillage Pharyngeal  Phase Impairments: Cough - Immediate    Nectar Thick Nectar Thick Liquid: Impaired Presentation: Cup;Spoon Pharyngeal Phase Impairments: Cough - Delayed   Honey Thick     Puree Puree: Impaired Pharyngeal Phase Impairments: Cough - Delayed   Solid     Solid: Not tested      Claudine Mouton 07/08/2023,3:19 PM

## 2023-07-08 NOTE — TOC Initial Note (Signed)
Transition of Care [facility]) - Initial/Assessment Note    Patient Details  Name: Alexander Bradley MRN: [id number] Date of Birth: March 01, 1941  [facility] CM/SW Contact:    Baldemar Lenis, LCSW Phone Number: 07/08/2023, 9:51 AM  Clinical Narrative:     Patient is from [facility] LTC and is able to return when stable. CSW to follow.         Expected Discharge Plan: Skilled Nursing Facility Barriers to Discharge: Continued Medical Work up   Patient Goals and CMS Choice Patient states their goals for this hospitalization and ongoing recovery are:: patient unable to participate in goal setting, not fully oriented CMS Medicare.gov Compare Post Acute Care list provided to:: Patient Represenative (must comment) Choice offered to / list presented to : Adult Children [place] ownership interest in [facility].provided to:: Adult Children    Expected Discharge Plan and Services     Post Acute Care Choice: Skilled Nursing Facility Living arrangements for the past 2 months: Skilled Nursing Facility                                      Prior Living Arrangements/Services Living arrangements for the past 2 months: Skilled Nursing Facility Lives with:: Facility Resident Patient language and need for interpreter reviewed:: No Do you feel safe going back to the place where you live?: Yes      Need for Family Participation in Patient Care: Yes (Comment) Care giver support system in place?: Yes (comment)   Criminal Activity/Legal Involvement Pertinent to Current Situation/Hospitalization: No - Comment as needed  Activities of Daily Living Home Assistive Devices/Equipment: Wheelchair, Environmental consultant (specify type) ADL Screening (condition at time of admission) Patient's cognitive ability adequate to safely complete daily activities?: No Is the patient deaf or have difficulty hearing?: Yes Does the patient have difficulty seeing, even when wearing  glasses/contacts?: Yes Does the patient have difficulty concentrating, remembering, or making decisions?: Yes Patient able to express need for assistance with ADLs?: No Does the patient have difficulty dressing or bathing?: Yes Independently performs ADLs?: No Communication: Dependent Is this a change from baseline?: Change from baseline, expected to last >3 days Dressing (OT): Dependent Is this a change from baseline?: Change from baseline, expected to last >3 days Grooming: Dependent Is this a change from baseline?: Change from baseline, expected to last >3 days Feeding: Dependent Is this a change from baseline?: Change from baseline, expected to last >3 days Bathing: Dependent Is this a change from baseline?: Change from baseline, expected to last >3 days Toileting: Dependent Is this a change from baseline?: Change from baseline, expected to last >3days In/Out Bed: Dependent Is this a change from baseline?: Change from baseline, expected to last >3 days Walks in Home: Dependent Is this a change from baseline?: Change from baseline, expected to last >3 days Does the patient have difficulty walking or climbing stairs?: Yes Weakness of Legs: Both Weakness of Arms/Hands: Both  Permission Sought/Granted Permission sought to share information with : Facility Medical sales representative, Family Supports Permission granted to share information with : Yes, Verbal Permission Granted  Share Information with NAME: [name]  Permission granted to share info w AGENCY: [employer]  Permission granted to share info w Relationship: Son     Emotional Assessment   Attitude/Demeanor/Rapport: Unable to Assess Affect (typically observed): Unable to Assess   Alcohol / Substance Use: Not Applicable Psych Involvement: No (comment)  Admission diagnosis:  Dehydration [E86.0] Encephalopathy acute [G93.40] Acute encephalopathy [G93.40] Patient Active Problem List   Diagnosis Date Noted   Acute  encephalopathy 07/05/2023   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 [place] [address] Phone: [phone] Fax: [phone]     Social Determinants of Health (SDOH) Social History: SDOH Screenings   Food Insecurity: No Food Insecurity (07/05/2023)  Housing: Low Risk  (07/05/2023)  Transportation Needs: No Transportation Needs (07/05/2023)  Utilities: Not At Risk (07/05/2023)  Tobacco Use: Low Risk  (07/05/2023)   SDOH Interventions:     Readmission Risk Interventions     No data to display

## 2023-07-08 NOTE — Plan of Care (Signed)

## 2023-07-08 NOTE — Evaluation (Signed)
Physical Therapy Evaluation Patient Details Name: Alexander Bradley MRN: [id number] DOB: 21-Feb-1941 Today's Date: 07/08/2023  History of Present Illness  Pt is an 82 y/o male presenting with acute encephalopathy. Work-up pending to determine etiology. PMH: dementia, kidney stones   Clinical Impression  Pt presented supine in bed with HOB elevated, awake and willing to participate in therapy session. Pt with dementia at baseline with no family/caregivers present to provide any reliable information on PLOF. Per chart review, pt is from [facility]. Pt very pleasant and agreeable throughout evaluation. He required mod-max A x2 for bed mobility and max A x2 for sit<>stand transfers with use of RW. Pt with significant posterior bias with transitional movement during sit<>stand despite maximal verbal and tactile cueing. Upon standing, pt continued to demonstrate a strong posterior bias but was able to partially correct with cueing for a very brief time (4-5 seconds). Of note, pt with productive cough during session and able to be titrated from 9L O2 HFNC to 2 L O2 with SpO2 > 95%. Pt's RN in room and aware. Pt would continue to benefit from skilled physical therapy services at this time while admitted and after d/c to address the below listed limitations in order to improve overall safety and independence with functional mobility.        Assistance Recommended at Discharge Frequent or constant Supervision/Assistance  If plan is discharge home, recommend the following:  Can travel by private vehicle  Two people to help with walking and/or transfers;Two people to help with bathing/dressing/bathroom;Assistance with cooking/housework;Assistance with feeding;Direct supervision/assist for financial management;Direct supervision/assist for medications management;Assist for transportation;Help with stairs or ramp for entrance   No    Equipment Recommendations None recommended by PT  Recommendations  for Other Services       Functional Status Assessment Patient has had a recent decline in their functional status and demonstrates the ability to make significant improvements in function in a reasonable and predictable amount of time.     Precautions / Restrictions Precautions Precautions: Fall;Other (comment) Precaution Comments: monitor SpO2 Restrictions Weight Bearing Restrictions: No      Mobility  Bed Mobility Overal bed mobility: Needs Assistance Bed Mobility: Supine to Sit, Sit to Supine     Supine to sit: Mod assist, +2 for physical assistance, +2 for safety/equipment Sit to supine: Max assist, +2 for physical assistance, +2 for safety/equipment   General bed mobility comments: Pt able to assist with LE to EOB and multimodal cues with moderate assist to lift trunk. Max A x 2 required to return to supine as pt attempting to lay back horizontally w/ posterior bias    Transfers Overall transfer level: Needs assistance Equipment used: Rolling walker (2 wheels) Transfers: Sit to/from Stand Sit to Stand: Max assist, +2 physical assistance, +2 safety/equipment           General transfer comment: Max A x 2 with cues/assist to lean forward, noted heavy bracing of BLE against bed w/ B foot blocking needed to prevent sliding due to posterior bias. Pt attempting to step/correct posture though Max A needed for balance-  unable to progress away from bed    Ambulation/Gait                  Stairs            Wheelchair Mobility     Tilt Bed    Modified Rankin (Stroke Patients Only)       Balance Overall balance assessment: Needs assistance Sitting-balance  support: No upper extremity supported, Feet supported Sitting balance-Leahy Scale: Poor Sitting balance - Comments: Initial min A w/ posterior bias progressing to close supervision > 5 min Postural control: Posterior lean Standing balance support: Bilateral upper extremity supported, During functional  activity Standing balance-Leahy Scale: Zero Standing balance comment: Max A for standing balance + 2 due to strong posterior bias                             Pertinent Vitals/Pain Pain Assessment Pain Assessment: Faces Faces Pain Scale: No hurt Pain Intervention(s): Monitored during session    Home Living Family/patient expects to be discharged to:: Skilled nursing facility                   Additional Comments: from [facility] per chart    Prior Function Prior Level of Function : Patient poor historian/Family not available             Mobility Comments: pt says "yes" when asked if he uses a RW; due to intelligible speech, unsure if pt ambulatory with RW or uses for transfers only ADLs Comments: Unsure d/t AMS and intelligible speech     Hand Dominance   Dominant Hand: Right    Extremity/Trunk Assessment   Upper Extremity Assessment Upper Extremity Assessment: Defer to OT evaluation    Lower Extremity Assessment Lower Extremity Assessment: Difficult to assess due to impaired cognition;Generalized weakness    Cervical / Trunk Assessment Cervical / Trunk Assessment: Kyphotic  Communication   Communication: Expressive difficulties;Other (comment) (low volume, slurred words)  Cognition Arousal/Alertness: Awake/alert Behavior During Therapy: WFL for tasks assessed/performed Overall Cognitive Status: History of cognitive impairments - at baseline                                 General Comments: hx of dementia; difficult to fully assess due to impaired speech. appeared to say he was at [facility] currently when asking orientation questions. does follow commands w/ multimodal cues; does attempt to respond to conversation        General Comments General comments (skin integrity, edema, etc.): On 9 L O2 HFNC on entry though cannula out of nose during ADLs bed level. SpO2 low 90s on RA bed level, 89% once sitting EOB - replaced with 2 L  O2 and pt sustained 95% throughout session    Exercises     Assessment/Plan    PT Assessment Patient needs continued PT services  PT Problem List Decreased strength;Decreased range of motion;Decreased activity tolerance;Decreased balance;Decreased mobility;Decreased coordination;Decreased cognition;Decreased knowledge of use of DME;Decreased safety awareness;Decreased knowledge of precautions       PT Treatment Interventions DME instruction;Gait training;Stair training;Functional mobility training;Therapeutic activities;Therapeutic exercise;Balance training;Neuromuscular re-education;Cognitive remediation;Patient/family education    PT Goals (Current goals can be found in the Care Plan section)  Acute Rehab PT Goals Patient Stated Goal: pt unable to state PT Goal Formulation: Patient unable to participate in goal setting Time For Goal Achievement: 07/22/23 Potential to Achieve Goals: Fair    Frequency Min 2X/week     Co-evaluation PT/OT/SLP Co-Evaluation/Treatment: Yes Reason for Co-Treatment: For patient/therapist safety;To address functional/ADL transfers PT goals addressed during session: Mobility/safety with mobility;Balance;Proper use of DME;Strengthening/ROM OT goals addressed during session: ADL's and self-care       AM-PAC PT "6 Clicks" Mobility  Outcome Measure Help needed turning from your back to your side while in  a flat bed without using bedrails?: A Lot Help needed moving from lying on your back to sitting on the side of a flat bed without using bedrails?: A Lot Help needed moving to and from a bed to a chair (including a wheelchair)?: Total Help needed standing up from a chair using your arms (e.g., wheelchair or bedside chair)?: A Lot Help needed to walk in hospital room?: Total Help needed climbing 3-5 steps with a railing? : Total 6 Click Score: 9    End of Session Equipment Utilized During Treatment: Gait belt Activity Tolerance: Patient tolerated  treatment well Patient left: in bed;with call bell/phone within reach;with bed alarm set Nurse Communication: Mobility status PT Visit Diagnosis: Other abnormalities of gait and mobility (R26.89)    Time: 1610-9604 PT Time Calculation (min) (ACUTE ONLY): 31 min   Charges:   PT Evaluation $PT Eval Moderate Complexity: 1 Mod   PT General Charges $$ ACUTE PT VISIT: 1 Visit         Arletta Bale, DPT  Acute Rehabilitation Services Office [phone]   Alexander Bradley 07/08/2023, 12:44 PM

## 2023-07-09 ENCOUNTER — Inpatient Hospital Stay (HOSPITAL_COMMUNITY): Payer: Medicare Other

## 2023-07-09 LAB — BASIC METABOLIC PANEL
Anion gap: 5 (ref 5–15)
BUN: 23 mg/dL (ref 8–23)
CO2: 22 mmol/L (ref 22–32)
Calcium: 7.8 mg/dL — ABNORMAL LOW (ref 8.9–10.3)
Chloride: 116 mmol/L — ABNORMAL HIGH (ref 98–111)
Creatinine, Ser: 0.91 mg/dL (ref 0.61–1.24)
GFR, Estimated: >60 mL/min (ref >60)
Glucose, Bld: 100 mg/dL — ABNORMAL HIGH (ref 70–99)
Potassium: 3.3 mmol/L — ABNORMAL LOW (ref 3.5–5.1)
Sodium: 143 mmol/L (ref 135–145)

## 2023-07-09 LAB — CBC
HCT: 27.7 % — ABNORMAL LOW (ref 39.0–52.0)
Hemoglobin: 8.9 g/dL — ABNORMAL LOW (ref 13.0–17.0)
MCH: 30.6 pg (ref 26.0–34.0)
MCHC: 32.1 g/dL (ref 30.0–36.0)
MCV: 95.2 fL (ref 80.0–100.0)
Platelets: 174 10*3/uL (ref 150–400)
RBC: 2.91 MIL/uL — ABNORMAL LOW (ref 4.22–5.81)
RDW: 14.6 % (ref 11.5–15.5)
WBC: 9.1 10*3/uL (ref 4.0–10.5)
nRBC: 0 % (ref 0.0–0.2)

## 2023-07-09 LAB — CULTURE, BLOOD (ROUTINE X 2): Special Requests: ADEQUATE

## 2023-07-09 MED ORDER — POTASSIUM CHLORIDE 10 MEQ/100ML IV SOLN
10.0000 meq | INTRAVENOUS | Status: AC
  Administered 2023-07-09 (×6): 10 meq via INTRAVENOUS
  Filled 2023-07-09 (×6): qty 100

## 2023-07-09 NOTE — Procedures (Signed)
Modified Barium Swallow Study  Patient Details  Name: Alexander Bradley MRN: [id number] Date of Birth: 04-27-1941  Today's Date: 07/09/2023  Modified Barium Swallow completed.  Full report located under Chart Review in the Imaging Section.  History of Present Illness Alexander Bradley is a 82 y.o. with a pertinent PMH of dementia, who presented with and was admitted for altered mental status. Suspected to have aspiration pna, one veiw CXRs have been clear. Has been observed to produce tan frothy fluid out of his mouth with spO2 desat to mid 80s%. Family had reported a desire for comfort measures. On 7/12 pt more responsive and sipping water. SLP asked to evaluate.   Clinical Impression MBS was completed in the lateral projection with barium impregnated thin liquids via spoon and cup, mildly thick liquids via cup, pureed material and dual textured solids.  He presented with an oropharyngeal dysphagia. In addition, there is concern for a possible esophageal dysphagia as sweep revealed it slow to clear.  Oral deficits were noted for lingual control, lingual motion and bolus prep/mastication.  These deficits led to a delay in A/P transport, oral residue and premature spillage.  Thin liquids were noted to resonate in the pyriform sinuses with all other textures resonating in the vallecula prior to the swallow trigger.  Pharyngeal deficits were noted for anterior hyoid excursion, laryngeal elevation, laryngeal vestibule closure, pharyngeal stripping wave, and base of tongue retraction.  These deficits lead to significant vallecula residue that he did not appear to be sensate to and penetration prior to/during the swallow given thin liquids.  Cued cough and reswallow were not successful to clear this material from the laryngeal vestibule.  Transient penetration was seen during the swallow given mildly thick liqujids.  This material did appear to completely clear the laryngeal vestibule.  Given amount  of residue he is at risk for penetration/aspiration after the swallow although none was seen during this study.  Suggest a ground diet with nectar thick liquids.  ST will follow to initiate swallowing therapy and for patient/family education.  He may benefit from ongoing therapy at the next level of care.  ORAL PHASE -Decreased lingual control was noted across textures leading to premature loss of the bolus.  Thin liquids resonated in the pyriforms and/or laryngeal vestibule with all other textures resonating in the vallecula prior to the swallow trigger. -Decreased/delayed lingual motion was seen leading to a delay in A/P transport. -Oral residue was noted across textures that he did not always appear sensate to. -Very slow mastication/bolus prep of dual textured solids.    PHARYNGEAL PHASE -Decreased anterior hyoid excursion and laryngeal elevation leading to a decrease in laryngeal vestibule closure. -Decreased base of tongue retraction and pharyngeal stripping wave leading to vallecula residue.  This residue was significant especially given thicker boluses and he did not appear sensate to it as consistent attempts to clear it were not seen.   -Penetration into the laryngeal vestibule was seen prior to and during the swallow given thin liquids.  Cued cough and reswallow did not clear this material from the vestibule.   -Transient penetration was seen during the swallow given mildly thick liquids. This material did appear to completely clear the laryngeal vestibule.  ESOPHAGEAL PHASE -Sweep revealed it slow to clear raising suspicion for a possible esophageal dysphagia.  He may benefit from an esophageal work up.     Factors that may increase risk of adverse event in presence of aspiration Alexander Bradley 2021): Poor general health  and/or compromised immunity;Reduced cognitive function;Limited mobility;Frail or deconditioned;Reduced saliva;Dependence for feeding and/or oral hygiene  Swallow  Evaluation Recommendations Recommendations: PO diet PO Diet Recommendation: Dysphagia 2 (Finely chopped);Mildly thick liquids (Level 2, nectar thick) Liquid Administration via: Cup Medication Administration: Crushed with puree (follow with a liquid wash) Supervision: Staff to assist with self-feeding Swallowing strategies  : Minimize environmental distractions;Slow rate;Small bites/sips;Check for pocketing or oral holding;Follow solids with liquids (No straws, single sips) Postural changes: Position pt fully upright for meals Oral care recommendations: Oral care BID (2x/day) Caregiver Recommendations: Avoid jello, ice cream, thin soups, popsicles     Alexander Aguas, MA, CCC-SLP Acute Rehab SLP [phone]  Alexander Bradley 07/09/2023,11:51 AM

## 2023-07-09 NOTE — Progress Notes (Signed)
HD#4 SUBJECTIVE:  Patient Summary: Alexander Bradley is a 82 y.o. with a pertinent PMH of dementia and admitted for declining mental status.   Overnight Events: NAEO  Interim History: Patient was evaluated at bedside. He was awake and resting comfortably in bed. He could answer short questions but remains confused. He states he was hungry and thirsty. He denied any pain or other concerns.   OBJECTIVE:  Vital Signs: Vitals:   07/08/23 1514 07/08/23 1955 07/08/23 2334 07/09/23 0308  BP: (!) 154/80 (!) 149/76 (!) 155/79 (!) 158/93  Pulse: 78 89 (!) 51 77  Resp: 19 18 18 18   Temp: 98.1 F (36.7 C) 99 F (37.2 C) 98.7 F (37.1 C) 98.7 F (37.1 C)  TempSrc: Axillary Axillary Axillary Axillary  SpO2: 94% 97% 97% 98%   Supplemental O2: Nasal Cannula SpO2: 98 % O2 Flow Rate (L/min): 9 L/min  There were no vitals filed for this visit.   Intake/Output Summary (Last 24 hours) at 07/09/2023 0634 Last data filed at 07/09/2023 0404 Gross per 24 hour  Intake 720.07 ml  Output 1300 ml  Net -579.93 ml   Net IO Since Admission: 3,336.65 mL [07/09/23 0634]  CBC    Component Value Date/Time   WBC 9.1 07/09/2023 0549   RBC 2.91 (L) 07/09/2023 0549   HGB 8.9 (L) 07/09/2023 0549   HCT 27.7 (L) 07/09/2023 0549   PLT 174 07/09/2023 0549   MCV 95.2 07/09/2023 0549   MCH 30.6 07/09/2023 0549   MCHC 32.1 07/09/2023 0549   RDW 14.6 07/09/2023 0549   LYMPHSABS 1.1 07/05/2023 1235   MONOABS 1.0 07/05/2023 1235   EOSABS 0.1 07/05/2023 1235   BASOSABS 0.1 07/05/2023 1235    Physical Exam: Physical Exam Constitutional:      Appearance: Normal appearance.  HENT:     Head: Normocephalic and atraumatic.  Cardiovascular:     Rate and Rhythm: Normal rate and regular rhythm.  Pulmonary:     Effort: Pulmonary effort is normal.  Skin:    General: Skin is warm.  Neurological:     General: No focal deficit present.  Psychiatric:        Attention and Perception: Attention normal.         Behavior: Behavior is cooperative.     Comments: Confused and did not remember why he was in the hospital but could answer short questions    ASSESSMENT/PLAN:  Assessment: Principal Problem:   Acute encephalopathy Active Problems:   Atrial flutter (HCC)   Essential hypertension   Vascular dementia without behavioral disturbance (HCC)  Plan: #Acute encephalopathy #Aspiration pneumonia Patient's encephalopathy continued to show improvement today.  His white blood cell count is decreased at 9.1.  Blood cultures have shown no growth so far.  He was on breathing comfortably on room air. Yesterday he was able to tolerate fluids with a straw will continue to work with SLP as he is still at risk for aspiration.  He is currently on day 3 of Unasyn. -Continue work with PT/OT and SLP -Trend CBC and blood cultures  #Hyperkalemia Potassium at 3.3. -Replenished with KCl 10 M EQ x 6  #Normocytic anemia #Iron deficiency Hemoglobin stable at 8.9 today.  -Holding iron as patient is currently being treated for an aspiration pneumonia -Trend CBC  Best Practice: Diet: N.p.o. VTE: enoxaparin (LOVENOX) injection 40 mg Start: 07/05/23 1630 Code: DNR AB: Unasyn Family Contact: Son, [name] DISPO: Anticipated discharge to Skilled nursing facility pending resolution of encephalopathy and tolerability  of oral feeds.  Signature: Morrie Sheldon, MD Internal Medicine Resident, PGY-1 [facility]  Pager: [phone]  Please contact the on call pager after 5 pm and on weekends at [phone].

## 2023-07-09 NOTE — Plan of Care (Signed)

## 2023-07-09 NOTE — Progress Notes (Addendum)
Speech Language Pathology Treatment: Dysphagia  Patient Details Name: Alexander Bradley MRN: [id number] DOB: [birth year] Today's Date: 07/09/2023 Time: 0810-0820 SLP Time Calculation (min) (ACUTE ONLY): 10 min  Assessment / Plan / Recommendation Clinical Impression  Patient seen today to assess readiness for MBS. He is alert and can follow some directions. Appears with upper airway congestion but able to cough and expectorate per SLP cues. Noted black-tinged liquid in suction canister; patient today coughing up yellow tinge viscous secretions. Few lower dentition but no upper teeth present. Patient reports he uses dentures therefore SLP left message on son's telephone from SLP speaker cell phone due to his number being long distance (with patient's permission). Requested son bring patient's dentures and adhesive if needed. Relayed that patient is scheduled for swallowing test and x-ray approximately 11 AM per x-ray department. Patient denies premorbid dysphagia however he does have dementia. He is ready for MBS today.   Patient benefited from moderate verbal cues to cough and expectorate viscous secretions that appeared to be in upper airway. SLP instructed him to continue this prior to his swallowing test and x-ray in hopes to clear secretions potentially improving swallow.  Pt's breathing quality temporarily improved with expectoration but rapidly returned to congestion.    SLP messaged MD to assure approval for MBS - noted brown tinged ? Secretions? In suction canister.  Per notes on 7/11 pt was coughing up brown colored mucus and has had BM x2 (last yesterday, type 6) while in hospital.  Today pt coughed up yellow tinged, viscous secretions. MD Alexander Bradley approved to proceed - advised would limit amount of barium provided to him.     HPI HPI: Alexander Bradley is a 82 y.o. with a pertinent PMH of dementia, who presented with and was admitted for altered mental status. Suspected to have  aspiration pna, one veiw CXRs have been clear. Has been observed to produce tan frothy fluid out of his mouth with spO2 desat to mid 80s%. Family had reported a desire for comfort measures. On 7/12 pt more responsive and sipping water. SLP asked to evaluate.      SLP Plan  MBS      Recommendations for follow up therapy are one component of a multi-disciplinary discharge planning process, led by the attending physician.  Recommendations may be updated based on patient status, additional functional criteria and insurance authorization.    Recommendations  Diet recommendations: Other(comment);Honey-thick liquid Medication Administration: Crushed with puree Compensations: Slow rate;Small sips/bites                  Oral care BID     Dysphagia, oropharyngeal phase (R13.12)     MBS    Alexander Infante, MS [facility] SLP Acute Rehab Services Office [phone]  Alexander Bradley  07/09/2023, 8:33 AM

## 2023-07-09 NOTE — Progress Notes (Signed)
Pharmacy Antibiotic Note  Alexander Bradley is a 82 y.o. male admitted on 07/05/2023 with suspicion and radiographic evidence of aspiration pneumonia. Pharmacy has been consulted for Unasyn (ampicillin-sulbactam) dosing. Renal function stable. WBC improving .  Plan: Continue Unasyn 1.5 grams, IV every six hours.     Temp (24hrs), Avg:98.8 F (37.1 C), Min:98.1 F (36.7 C), Max:99.1 F (37.3 C)  Recent Labs  Lab 07/05/23 1235 07/05/23 1533 07/06/23 0241 07/07/23 1059 07/08/23 0644 07/09/23 0549  WBC 8.9  --  12.0* 18.0* 13.3* 9.1  CREATININE 1.12  --  1.11 1.28* 1.01 0.91  LATICACIDVEN 1.1 1.4  --   --   --   --     CrCl cannot be calculated (Unknown ideal weight.).    Allergies  Allergen Reactions   Transderm-Scop [Scopolamine] Other (See Comments)    Unknown reaction Documented on [facility]    Microbiology results: 7/9 BCx right hand: No growth 4 days on 7/13 7/9 BCx: right forearm: No growth 4 days on 7/13 7/9/ UCx: No growth, final report on 7/10 7/11 BCx--repeated>>    Thank you for allowing pharmacy to be a part of this patient's care.  Carolee Channell, PHarmD, CPP 07/09/2023 9:49 AM

## 2023-07-10 DIAGNOSIS — D509 Iron deficiency anemia, unspecified: Secondary | ICD-10-CM

## 2023-07-10 DIAGNOSIS — E876 Hypokalemia: Secondary | ICD-10-CM

## 2023-07-10 LAB — GLUCOSE, CAPILLARY
Glucose-Capillary: 103 mg/dL — ABNORMAL HIGH (ref 70–99)
Glucose-Capillary: 104 mg/dL — ABNORMAL HIGH (ref 70–99)

## 2023-07-10 LAB — CULTURE, BLOOD (ROUTINE X 2): Culture: NO GROWTH

## 2023-07-10 LAB — BASIC METABOLIC PANEL
Anion gap: 5 (ref 5–15)
BUN: 19 mg/dL (ref 8–23)
CO2: 23 mmol/L (ref 22–32)
Calcium: 7.9 mg/dL — ABNORMAL LOW (ref 8.9–10.3)
Chloride: 113 mmol/L — ABNORMAL HIGH (ref 98–111)
Creatinine, Ser: 0.98 mg/dL (ref 0.61–1.24)
GFR, Estimated: >60 mL/min (ref >60)
Glucose, Bld: 99 mg/dL (ref 70–99)
Potassium: 3.5 mmol/L (ref 3.5–5.1)
Sodium: 141 mmol/L (ref 135–145)

## 2023-07-10 LAB — CBC
HCT: 30.1 % — ABNORMAL LOW (ref 39.0–52.0)
Hemoglobin: 9.4 g/dL — ABNORMAL LOW (ref 13.0–17.0)
MCH: 29.7 pg (ref 26.0–34.0)
MCHC: 31.2 g/dL (ref 30.0–36.0)
MCV: 95.3 fL (ref 80.0–100.0)
Platelets: 196 10*3/uL (ref 150–400)
RBC: 3.16 MIL/uL — ABNORMAL LOW (ref 4.22–5.81)
RDW: 14.5 % (ref 11.5–15.5)
WBC: 9.2 10*3/uL (ref 4.0–10.5)
nRBC: 0 % (ref 0.0–0.2)

## 2023-07-10 NOTE — Progress Notes (Addendum)
HD#5 SUBJECTIVE:  Patient Summary: Alexander Bradley is a 82 y.o. with a pertinent PMH of Alzheimer's dementia, who presented with and was admitted for AMS.   Overnight Events:  No acute overnight events   Interm History:  Patient was laying in bed upon entry into room. He is verbal now and reports that he is feeling much better. He ate some solid foods for breakfast and says he did not have much trouble swallowing. Encouraged him to try to get into the chair today with help of PT. Informed that he could probably be discharged within the next 2 days.  OBJECTIVE:  Vital Signs: Vitals:   07/10/23 0019 07/10/23 0344 07/10/23 0855 07/10/23 1153  BP: (!) 155/105 (!) 168/98 (!) 145/92 (!) 145/84  Pulse: 85 61 (!) 53 85  Resp: 16 16 16 18   Temp: 98.8 F (37.1 C) 98.5 F (36.9 C) 97.7 F (36.5 C) (!) 97.5 F (36.4 C)  TempSrc: Axillary Axillary Oral Oral  SpO2: 98% 98% 99% 96%   Supplemental O2: Room Air SpO2: 96 % O2 Flow Rate (L/min): 9 L/min   Intake/Output Summary (Last 24 hours) at 07/10/2023 1216 Last data filed at 07/10/2023 0459 Gross per 24 hour  Intake 1275.38 ml  Output 600 ml  Net 675.38 ml   Net IO Since Admission: 4,012.03 mL [07/10/23 1216]  Physical Exam: Gen: alert, well appearing, in no acute distress HEENT: normocephalic, atraumatic CV: irregular rate, no MRG Pulm: normal WOB, clear to auscultation bilaterally Ab: normoactive bowel sounds, no tenderness or masses Psych: normal mood and affect   Patient Lines/Drains/Airways Status     Active Line/Drains/Airways     Name Placement date Placement time Site Days   Peripheral IV 07/05/23 20 G Anterior;Right Forearm 07/05/23  1403  Forearm  5   Peripheral IV 07/07/23 22 G 1.75" Anterior;Left Forearm 07/07/23  1411  Forearm  3   External Urinary Catheter 07/06/23  0700  --  4            Pertinent Labs:    Latest Ref Rng & Units 07/10/2023    3:53 AM 07/09/2023    5:49 AM 07/08/2023    6:44  AM  CBC  WBC 4.0 - 10.5 K/uL 9.2  9.1  13.3   Hemoglobin 13.0 - 17.0 g/dL 9.4  8.9  8.8   Hematocrit 39.0 - 52.0 % 30.1  27.7  27.3   Platelets 150 - 400 K/uL 196  174  171        Latest Ref Rng & Units 07/10/2023    3:53 AM 07/09/2023    5:49 AM 07/08/2023    6:44 AM  CMP  Glucose 70 - 99 mg/dL 99  161  096   BUN 8 - 23 mg/dL 19  23  29    Creatinine 0.61 - 1.24 mg/dL 0.45  4.09  8.11   Sodium 135 - 145 mmol/L 141  143  142   Potassium 3.5 - 5.1 mmol/L 3.5  3.3  3.2   Chloride 98 - 111 mmol/L 113  116  116   CO2 22 - 32 mmol/L 23  22  22    Calcium 8.9 - 10.3 mg/dL 7.9  7.8  7.8     Recent Labs    07/08/23 1119 07/08/23 1514 07/10/23 1155  GLUCAP 101* 120* 103*     Pertinent Imaging: No results found.  ASSESSMENT/PLAN:  Assessment: Principal Problem:   Acute encephalopathy Active Problems:   Atrial flutter (  HCC)   Essential hypertension   Vascular dementia without behavioral disturbance (HCC)   Alexander Bradley is a 82 y.o. with pertinent PMH of Alzheimer's dementia and afib who presented with and was admitted for AMS on hospital day 5  Plan: #Acute Encephalopathy #Alzheimer's Dementia Patient's mental status continues to improve. He converses well although he is sometimes difficult to understand. Unclear if he has returned to baseline status--will ask son when he returns. He is now well hydrated. He reports he is eating solid foods with minimal difficulty swallowing. He needs max help for sit to stand transfers, which seems to be baseline mobility. He is on day 4/5 of Unasyn for aspiration pneumonia and is now afebrile with normal WBC. Concern for infection driving his encephalopathy, but he is now improving and may be discharged with PT/OT/SLP rehab follow up today or tomorrow. -Continue Unasyn 1.5 g q6 until tomorrow -PT/OT/SLP  #Afib Patient has no prior documented hx of afib but was taking Xarelto prior to admission. ECG early in hospital course showed  afib with RVR. Plan to continue DOAC outpatient. Rates have been improved with several occurrences of bradycardia, will not initiate AVN blockade at this time. 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 Lowest Hgb was 8.8 and has improved to 9.4. Will start PO iron supplement at discharge. -PO iron at discharge  #Hypokalemia Potassium level 3.3 yesterday. Given Kcl 10 meq x6 and now improved to 3.5.  Best Practice: Diet: DYS 2 diet IVF: Fluids: none, Rate: None VTE: enoxaparin (LOVENOX) injection 40 mg Start: 07/05/23 1630 Code: DNR AB: Unasyn 1.5 g q6 until 07/11/23 Therapy Recs: ALF, DME: none Family Contact: Delaine Lame, to be notified. DISPO: Anticipated discharge tomorrow to Home pending  improved mental status and mobility .  Signature: Randell Patient, Medical Student   Please contact the on call pager after 5 pm and on weekends at [phone].

## 2023-07-10 NOTE — Plan of Care (Signed)

## 2023-07-10 NOTE — TOC Progression Note (Addendum)
Transition of Care [facility]) - Progression Note    Patient Details  Name: Alexander Bradley MRN: [id number] Date of Birth: 27-Mar-1941  Transition of Care [facility]) CM/SW Contact  Dellie Burns [place], [place] Phone Number: 07/10/2023, 12:40 PM  Clinical Narrative:   Per MD, pt medically stable for dc back to [facility] where he is a LTC resident.   1030am: call x2 to [place] in admissions [phone], no response.   1100am: text to [place] in admissions [phone], no response.   1130am: call to building [phone], no answer, voicemail left requesting return call  1220pm: spoke to to Nursing Supervisor at [facility] [phone] who reports she will need to confirm return with [place] as a different person was placed in pt's room and she is unsure pt's new bed assignment. Explained [place] has not responded and [place] plans to attempt her personal cell. SW will provide updates as available.   1256pm: return call received from [place] who is requesting dc Monday due to staffing shortages today. Updated MD who is agreeable to dc Monday. [place] updated.     Expected Discharge Plan: Skilled Nursing Facility Barriers to Discharge: Continued Medical Work up  Expected Discharge Plan and Services     Post Acute Care Choice: Skilled Nursing Facility Living arrangements for the past 2 months: Skilled Nursing Facility                                       Social Determinants of Health (SDOH) Interventions SDOH Screenings   Food Insecurity: No Food Insecurity (07/05/2023)  Housing: Low Risk  (07/05/2023)  Transportation Needs: No Transportation Needs (07/05/2023)  Utilities: Not At Risk (07/05/2023)  Tobacco Use: Low Risk  (07/05/2023)    Readmission Risk Interventions     No data to display

## 2023-07-11 DIAGNOSIS — J690 Pneumonitis due to inhalation of food and vomit: Secondary | ICD-10-CM

## 2023-07-11 DIAGNOSIS — D509 Iron deficiency anemia, unspecified: Secondary | ICD-10-CM

## 2023-07-11 LAB — CULTURE, BLOOD (ROUTINE X 2)
Culture: NO GROWTH
Special Requests: ADEQUATE
Special Requests: ADEQUATE

## 2023-07-11 LAB — COMPREHENSIVE METABOLIC PANEL
ALT: 23 U/L (ref 0–44)
AST: 18 U/L (ref 15–41)
Albumin: 2.1 g/dL — ABNORMAL LOW (ref 3.5–5.0)
Alkaline Phosphatase: 53 U/L (ref 38–126)
Anion gap: 4 — ABNORMAL LOW (ref 5–15)
BUN: 18 mg/dL (ref 8–23)
CO2: 24 mmol/L (ref 22–32)
Calcium: 7.9 mg/dL — ABNORMAL LOW (ref 8.9–10.3)
Chloride: 112 mmol/L — ABNORMAL HIGH (ref 98–111)
Creatinine, Ser: 0.97 mg/dL (ref 0.61–1.24)
GFR, Estimated: >60 mL/min (ref >60)
Glucose, Bld: 101 mg/dL — ABNORMAL HIGH (ref 70–99)
Potassium: 3.6 mmol/L (ref 3.5–5.1)
Sodium: 140 mmol/L (ref 135–145)
Total Bilirubin: 0.5 mg/dL (ref 0.3–1.2)
Total Protein: 5.5 g/dL — ABNORMAL LOW (ref 6.5–8.1)

## 2023-07-11 MED ORDER — FERROUS SULFATE 325 (65 FE) MG PO TBEC: 325.0000 mg | DELAYED_RELEASE_TABLET | Freq: Every day | ORAL | Status: DC

## 2023-07-11 NOTE — Progress Notes (Signed)
Speech Language Pathology Treatment: Dysphagia  Patient Details Name: Alexander Bradley MRN: [id number] DOB: [birth year] Today's Date: 07/11/2023 Time: 5621-3086 SLP Time Calculation (min) (ACUTE ONLY): 27 min  Assessment / Plan / Recommendation Clinical Impression  Pt seen for dyphagia f/u tx session with son in attendance. Pt consumed nectar-thick liquids via cup/straw and upgraded consistency of solids with impaired mastication, slow bolus manipulation, delayed cough and mod verbal cues/education provided for swallowing strategies including repetitive swallows, small bite/sip size, upright positioning and liquid wash prn. Pt with delayed cough without use of compensatory strategies of repetitive swallows and liquid wash or throat clear/re-swallow to clear probable pharyngeal residue (as seen on MBS). Son in agreement with swallowing strategies implemented and hand-out given to son to return to [place]. Recommend continue current diet of Dysphagia 2(minced)/nectar thickened liquids with swallowing precautions/esophageal precautions in place and FULL supervision with meals with pt self-feeding with A. Pt was impulsive during intake with SLP/son providing verbal cues intermittently for smaller bite size. Recommend ST f/u at next venue of care for dysphagia tx/management.  ST will continue to f/u in acute setting for dysphagia needs.   HPI HPI: Alexander Bradley is a 82 y.o. with a pertinent PMH of dementia, who presented with and was admitted for altered mental status. Suspected to have aspiration pna, one veiw CXRs have been clear. Has been observed to produce tan frothy fluid out of his mouth with spO2 desat to mid 80s%. Family had reported a desire for comfort measures. On 7/12 pt more responsive and sipping water. SLP performed MBS after clinical swallow evaluation with recommendation for Dysphagia 2/nectar thick liquids.  ST f/u for dysphagia tx/management.Marland Kitchen      SLP Plan  Continue with  current plan of care      Recommendations for follow up therapy are one component of a multi-disciplinary discharge planning process, led by the attending physician.  Recommendations may be updated based on patient status, additional functional criteria and insurance authorization.    Recommendations  Diet recommendations: Dysphagia 2 (fine chop);Nectar-thick liquid Liquids provided via: Cup;Straw Medication Administration: Crushed with puree Supervision: Full supervision/cueing for compensatory strategies;Staff to assist with self feeding Compensations: Slow rate;Small sips/bites;Follow solids with liquid;Multiple dry swallows after each bite/sip;Clear throat intermittently;Effortful swallow                  Oral care BID   Frequent or constant Supervision/Assistance Dysphagia, oropharyngeal phase (R13.12)     Continue with current plan of care     Tressie Stalker  07/11/2023, 1:32 PM

## 2023-07-11 NOTE — Care Management Important Message (Signed)
Important Message  Patient Details  Name: Yahel Fuston MRN: [id number] Date of Birth: 12/25/1941   Medicare Important Message Given:  Yes     Sherilyn Banker 07/11/2023, 4:17 PM

## 2023-07-11 NOTE — Progress Notes (Signed)
Physical Therapy Treatment Patient Details Name: Alexander Bradley MRN: [id number] DOB: [birth year] Today's Date: 07/11/2023   History of Present Illness Pt is an 82 y/o male presenting with acute encephalopathy. Work-up pending to determine etiology. PMH: dementia, kidney stones    PT Comments  Pt required max assist bed mobility, and max assist sit to stand with RW. STS x 4 trials. Pt sustaining static stand 30 seconds/trial with strong posterior bias. Pt returned to supine in bed at end of session.      Assistance Recommended at Discharge Frequent or constant Supervision/Assistance  If plan is discharge home, recommend the following:  Can travel by private vehicle    Two people to help with walking and/or transfers;Two people to help with bathing/dressing/bathroom;Assistance with cooking/housework;Assistance with feeding;Direct supervision/assist for financial management;Direct supervision/assist for medications management;Assist for transportation;Help with stairs or ramp for entrance   No  Equipment Recommendations  None recommended by PT    Recommendations for Other Services       Precautions / Restrictions Precautions Precautions: Fall;Other (comment) Precaution Comments: monitor SpO2     Mobility  Bed Mobility Overal bed mobility: Needs Assistance Bed Mobility: Supine to Sit, Sit to Supine     Supine to sit: Max assist Sit to supine: Max assist   General bed mobility comments: increased time, assist for all aspects of mobility    Transfers Overall transfer level: Needs assistance Equipment used: Rolling walker (2 wheels) Transfers: Sit to/from Stand Sit to Stand: Max assist, From elevated surface           General transfer comment: Pt pulling up on RW. Therapist blocking bilat feet from sliding, supporting RW, and assisting with power up. STS x 4 trials.    Ambulation/Gait               General Gait Details: unable   Stairs              Wheelchair Mobility     Tilt Bed    Modified Rankin (Stroke Patients Only)       Balance Overall balance assessment: Needs assistance Sitting-balance support: No upper extremity supported, Feet supported Sitting balance-Leahy Scale: Fair   Postural control: Posterior lean Standing balance support: Bilateral upper extremity supported, During functional activity, Reliant on assistive device for balance Standing balance-Leahy Scale: Zero Standing balance comment: strong posterior bias                            Cognition Arousal/Alertness: Awake/alert Behavior During Therapy: WFL for tasks assessed/performed Overall Cognitive Status: History of cognitive impairments - at baseline                                          Exercises      General Comments General comments (skin integrity, edema, etc.): VSS on RA      Pertinent Vitals/Pain Pain Assessment Pain Assessment: Faces Faces Pain Scale: No hurt    Home Living                          Prior Function            PT Goals (current goals can now be found in the care plan section) Acute Rehab PT Goals Patient Stated Goal: pt unable to state Progress towards PT goals: Progressing toward  goals    Frequency    Min 2X/week      PT Plan Current plan remains appropriate    Co-evaluation              AM-PAC PT "6 Clicks" Mobility   Outcome Measure  Help needed turning from your back to your side while in a flat bed without using bedrails?: A Lot Help needed moving from lying on your back to sitting on the side of a flat bed without using bedrails?: A Lot Help needed moving to and from a bed to a chair (including a wheelchair)?: Total Help needed standing up from a chair using your arms (e.g., wheelchair or bedside chair)?: Total Help needed to walk in hospital room?: Total Help needed climbing 3-5 steps with a railing? : Total 6 Click Score: 8    End of  Session Equipment Utilized During Treatment: Gait belt Activity Tolerance: Patient tolerated treatment well Patient left: in bed;with call bell/phone within reach;with bed alarm set Nurse Communication: Mobility status PT Visit Diagnosis: Other abnormalities of gait and mobility (R26.89)     Time: 1610-9604 PT Time Calculation (min) (ACUTE ONLY): 23 min  Charges:    $Therapeutic Activity: 23-37 mins PT General Charges $$ ACUTE PT VISIT: 1 Visit                     Ferd Glassing., PT  Office # [phone]    Ilda Foil 07/11/2023, 12:14 PM

## 2023-07-11 NOTE — NC FL2 (Signed)
  [place] MEDICAID FL2 LEVEL OF CARE FORM     IDENTIFICATION  Patient Name: Silas Muff Birthdate: 05-14-1941 Sex: male Admission Date (Current Location): 07/05/2023  [facility] and [place] Number:  Producer, television/film/video and Address:  [facility], 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   Atrial flutter (HCC) 06/23/2016   Essential hypertension 06/23/2016   Hyperlipidemia 06/23/2016    Orientation RESPIRATION BLADDER Height & Weight     Self, Place  Normal Incontinent Weight:   Height:     BEHAVIORAL SYMPTOMS/MOOD NEUROLOGICAL BOWEL NUTRITION STATUS      Incontinent Diet (see DC summary)  AMBULATORY STATUS COMMUNICATION OF NEEDS Skin   Extensive Assist Verbally Normal                       Personal Care Assistance Level of Assistance  Bathing, Feeding, Dressing Bathing Assistance: Maximum assistance Feeding assistance: Limited assistance Dressing Assistance: Maximum assistance     Functional Limitations Info  Speech     Speech Info: Impaired (dysarthria)    SPECIAL CARE FACTORS FREQUENCY                       Contractures Contractures Info: Not present    Additional Factors Info  Code Status, Allergies Code Status Info: DNR Allergies Info: Transderm-scop (Scopolamine)           Current Medications (07/11/2023):  This is the current hospital active medication list Current Facility-Administered  Medications  Medication Dose Route Frequency Provider Last Rate Last Admin   albuterol (PROVENTIL) (2.5 MG/3ML) 0.083% nebulizer solution 2.5 mg  2.5 mg Nebulization Q4H PRN Tyson Alias, MD   2.5 mg at 07/07/23 0236   ampicillin-sulbactam (UNASYN) 1.5 g in sodium chloride 0.9 % 100 mL IVPB  1.5 g Intravenous Q6H Tyson Alias, MD 200 mL/hr at 07/11/23 0755 1.5 g at 07/11/23 0755   enoxaparin (LOVENOX) injection 40 mg  40 mg Subcutaneous Q24H Atway, Rayann N, DO   40 mg at 07/11/23 0801   Oral care mouth rinse  15 mL Mouth Rinse 4 times per day Tyson Alias, MD   15 mL at 07/11/23 0801   Oral care mouth rinse  15 mL Mouth Rinse PRN Tyson Alias, MD         Discharge Medications: Please see discharge summary for a list of discharge medications.  Relevant Imaging Results:  Relevant Lab Results:   Additional Information SS#: [id number]  Baldemar [place], [place]

## 2023-07-11 NOTE — Discharge Summary (Addendum)
Name: Alexander Bradley MRN: [id number] DOB: July 14, 1941 82 y.o. PCP: Eloisa Northern, MD  Date of Admission: 07/05/2023 12:10 PM Date of Discharge: 07/11/23 Attending Physician: Dickie La, MD  Discharge Diagnosis: 1. Principal Problem:   Acute encephalopathy Active Problems:   Atrial flutter [facility])   Essential hypertension   Vascular dementia without behavioral disturbance [facility])   Discharge Medications: Allergies as of 07/11/2023       Reactions   Transderm-scop [scopolamine] Other (See Comments)   Unknown reaction Documented on MAR        Medication List     STOP taking these medications    olmesartan 20 MG tablet Commonly known as: BENICAR   sulfamethoxazole-trimethoprim 800-160 MG tablet Commonly known as: BACTRIM DS       TAKE these medications    amLODipine 5 MG tablet Commonly known as: NORVASC Take 2.5 mg by mouth daily.   aspirin 81 MG chewable tablet Chew 81 mg by mouth daily.   atropine 1 % ophthalmic solution Place 1 drop under the tongue 3 (three) times daily. For excessive drooling   ferrous sulfate 325 (65 FE) MG EC tablet Take 1 tablet (325 mg total) by mouth daily with breakfast.   losartan 50 MG tablet Commonly known as: COZAAR Take 50 mg by mouth every evening.   rivaroxaban 20 MG Tabs tablet Commonly known as: XARELTO Take 20 mg by mouth every evening.   senna 8.6 MG Tabs tablet Commonly known as: SENOKOT Take 17.2 mg by mouth 2 (two) times daily.   sertraline 25 MG tablet Commonly known as: ZOLOFT Take 75 mg by mouth at bedtime.   Vitamin D3 1000 units Caps Take 1,000 Units by mouth daily.        Disposition and follow-up:   [name] was discharged from [facility] in Stable condition.  At the hospital follow up visit please address:  1.  Follow-Up  A. Acute encephalopathy due to aspiration pneumonia: assess mental status, vitals, and respiratory status  B. Atrial Fibrillation:  Assess heart rate and symptoms, ensure adherence to Xarelto  C. Normocytic Anemia w/ Iron Deficiency Anemia: Assess CBC and adherence to oral tablets  D. Hypokalemia: Assess BMP  2.  Labs / imaging needed at time of follow-up: Hemoglobin, Iron, BMP  3.  Pending labs/ test needing follow-up: None  4.  Medication changes:   -Begin 1 tablet of 325 mg ferrous sulfate daily  -Stop olmesartan 20 mg and Bactrim 800-160 mg tablet  Follow-up Appointments:  Contact information for after-discharge care     Destination     [facility]erred [facility] .   Service: Skilled Nursing Contact information: [address] Test Update Address [place] [address] [phone]                    Hospital Course by problem list: #Acute encephalopathy most likely due to aspiration pneumonia #Alzheimer's disease Patient presented with acute AMS. He was nonverbal and unable to follow directions with choreiform movements of his extremities. He had markedly dry mucous membranes and was started on LR IVF. CXR and CT head were unremarkable. CT abdomen/pelvis positive only for bladder wall thickening. EEG showed no seizure like activity. He had recently started tx for a UTI with Bactrim and was changed to ceftriaxone in the hospital until urine cultures came back negative. Blood cultures also negative. Labs were unremarkable at presentation but patient became febrile and WBC count increased. He was at risk  of aspiration and started treatment for aspiration pneumonia with Unasyn though CXR remained negative. He became short of breath and required 9 L of oxygen by nasal cannula. Fever, WBC counts and mental status improved with rehydration and abx. At discharge, patient received last dose of Unasyn with improvement in mental status.  He was also breathing on room air.  #Atrial fibrillation Patient has no documented history of afib in charts but was on Xarelto before admission. Patient did  have evidence of afib with RVR on EKG early in the hospital course. Rates improved with several occurrences of bradycardia and AVN blockade was not initiated at this time. Continue Xarelto.  #Normocytic anemia #Iron deficiency anemia Patient has a mixed inflammatory anemia with iron deficient anemia with a serum iron of 10, TIBC of 186, and ferritin of 48. Lowest Hgb was 8.8 and has improved to 9.4 prior to discharge.  Begin oral iron at discharge.  #Hypokalemia Patient had decreased potassium during stay.  The lowest value was 3.2.  This was replenished with KCl.  At discharge, his potassium was 3.6.  #Hypertension Patient home medication list included both olmesartan and losartan.  Olmesartan was discontinued due to concern with double ARB therapy.  At discharge, patient will resume single ARB therapy with losartan.  Discharge Exam:   BP (!) 164/89 (BP Location: Left Arm)   Pulse 79   Temp 98.3 F (36.8 C) (Oral)   Resp 18   SpO2 96%   Subjective  Prior to discharge, patient was evaluated at bedside.  He was awake and resting comfortably in bed.  He could answer questions and participate in our assessment.  He denied any nausea, vomiting, pain, or other concerns, noted he felt well and was understanding of the plan return to his nursing facility for further rehabilitation.  Physical Exam Constitutional:      Appearance: Normal appearance.  HENT:     Head: Normocephalic and atraumatic.  Cardiovascular:     Rate and Rhythm: Normal rate and regular rhythm.  Pulmonary:     Effort: Pulmonary effort is normal.     Breath sounds: Normal breath sounds.  Abdominal:     General: Abdomen is flat. Bowel sounds are normal.     Palpations: Abdomen is soft.  Skin:    General: Skin is warm.  Neurological:     General: No focal deficit present.     Mental Status: He is alert.  Psychiatric:        Mood and Affect: Mood and affect normal.    Pertinent Labs, Studies, and Procedures:   CBC    Component Value Date/Time   WBC 9.2 07/10/2023 0353   RBC 3.16 (L) 07/10/2023 0353   HGB 9.4 (L) 07/10/2023 0353   HCT 30.1 (L) 07/10/2023 0353   PLT 196 07/10/2023 0353   MCV 95.3 07/10/2023 0353   MCH 29.7 07/10/2023 0353   MCHC 31.2 07/10/2023 0353   RDW 14.5 07/10/2023 0353   LYMPHSABS 1.1 07/05/2023 1235   MONOABS 1.0 07/05/2023 1235   EOSABS 0.1 07/05/2023 1235   BASOSABS 0.1 07/05/2023 1235   CMP     Component Value Date/Time   NA 140 07/11/2023 0719   K 3.6 07/11/2023 0719   CL 112 (H) 07/11/2023 0719   CO2 24 07/11/2023 0719   GLUCOSE 101 (H) 07/11/2023 0719   BUN 18 07/11/2023 0719   CREATININE 0.97 07/11/2023 0719   CALCIUM 7.9 (L) 07/11/2023 0719   PROT 5.5 (L) 07/11/2023 0719  ALBUMIN 2.1 (L) 07/11/2023 0719   AST 18 07/11/2023 0719   ALT 23 07/11/2023 0719   ALKPHOS 53 07/11/2023 0719   BILITOT 0.5 07/11/2023 0719   GFRNONAA >60 07/11/2023 0719   Iron/TIBC/Ferritin/ %Sat    Component Value Date/Time   IRON 10 (L) 07/08/2023 0644   TIBC 186 (L) 07/08/2023 0644   FERRITIN 48 07/08/2023 0644   IRONPCTSAT 5 (L) 07/08/2023 0644    Discharge Instructions: Discharge Instructions     Call MD for:  difficulty breathing, headache or visual disturbances   Complete by: As directed    Call MD for:  persistant dizziness or light-headedness   Complete by: As directed    Call MD for:  temperature >100.4   Complete by: As directed    Diet - low sodium heart healthy   Complete by: As directed    Discharge instructions   Complete by: As directed    Mr. Alexander Bradley, Alexander Bradley were admitted for declining mental status.  We believe this may have been caused by dehydration so you received fluids.  We also believe this may have been caused by pneumonia so you were treated with antibiotics.  During this time, you also had troubles breathing so you received extra oxygen.  By the time you left, you did not require any extra oxygen!  During your hospitalization, we  also performed lab work that discovered you are anemic.  This is likely due to to decreased iron levels that we also found in your lab work.  You will start iron tablets after discharge to help fix this.  The following medications were *STOPPED: - Olmesartan 20 mg  - Bactrim 800-160 mg   You will *START taking the following medication:  - Ferrous sulfate 325 mg once daily with breakfast  You will continue taking the following medications: -Amlodipine 2.5 mg daily -Aspirin 81 mg daily -Losartan 50 mg daily -Atropine 1% ophthalmic solution 1 drop under the tongue 3 times daily -Rivaroxaban 20 mg every evening -Senna 17.2 mg twice daily -Sertraline 75 mg at bedtime -Vitamin B3 1000 units daily  If you have any questions, please reach out to [place] at [phone].  We are glad you are feeling better!   Increase activity slowly   Complete by: As directed        Signed: Morrie Sheldon, MD 07/11/2023, 11:50 AM   Pager: [phone]

## 2023-07-11 NOTE — TOC Transition Note (Signed)
Transition of Care [facility]) - CM/SW Discharge Note   Patient Details  Name: Alexander Bradley MRN: [id number] Date of Birth: 1941-06-27  [facility]) CM/SW Contact:  Baldemar Lenis, LCSW Phone Number: 07/11/2023, 1:58 PM   Clinical Narrative:   CSW updated by MD that patient stable to return to SNF today. CSW confirmed with [facility] that bed is available for patient to return. CSW sent discharge information, coordinated with RN to schedule transportation after patient's medication completed. Transport arranged with [facility] for next available. CSW updated patient's son, Alexander Bradley, who is in agreement.  Report has been called. Son is asking for a call when transport arranges, CSW notified RN. No further TOC needs.    Final next level of care: Skilled Nursing Facility Barriers to Discharge: Barriers Resolved   Patient Goals and CMS Choice CMS Medicare.gov Compare Post Acute Care list provided to:: Patient Represenative (must comment) Choice offered to / list presented to : Adult Children  Discharge Placement                Patient chooses bed at: [facility] Patient to be transferred to facility by: [facility] Name of family member notified: Alexander Bradley Patient and family notified of of transfer: 07/11/23  Discharge Plan and Services Additional resources added to the After Visit Summary for       Post Acute Care Choice: Skilled Nursing Facility                               Social Determinants of Health (SDOH) Interventions SDOH Screenings   Food Insecurity: No Food Insecurity (07/05/2023)  Housing: Low Risk  (07/05/2023)  Transportation Needs: No Transportation Needs (07/05/2023)  Utilities: Not At Risk (07/05/2023)  Tobacco Use: Low Risk  (07/05/2023)     Readmission Risk Interventions     No data to display

## 2023-07-12 DIAGNOSIS — K5900 Constipation, unspecified: Secondary | ICD-10-CM

## 2023-07-12 DIAGNOSIS — F0390 Unspecified dementia without behavioral disturbance: Secondary | ICD-10-CM

## 2023-07-12 DIAGNOSIS — Z9181 History of falling: Secondary | ICD-10-CM

## 2023-07-12 DIAGNOSIS — R2689 Other abnormalities of gait and mobility: Secondary | ICD-10-CM

## 2023-07-12 DIAGNOSIS — N182 Chronic kidney disease, stage 2 (mild): Secondary | ICD-10-CM

## 2023-07-12 DIAGNOSIS — I10 Essential (primary) hypertension: Secondary | ICD-10-CM

## 2023-07-12 DIAGNOSIS — M6281 Muscle weakness (generalized): Secondary | ICD-10-CM

## 2023-07-12 DIAGNOSIS — Z86718 Personal history of other venous thrombosis and embolism: Secondary | ICD-10-CM

## 2023-07-12 DIAGNOSIS — D649 Anemia, unspecified: Secondary | ICD-10-CM

## 2023-07-12 LAB — CULTURE, BLOOD (ROUTINE X 2): Culture: NO GROWTH

## 2023-08-03 DIAGNOSIS — Z86718 Personal history of other venous thrombosis and embolism: Secondary | ICD-10-CM

## 2023-08-03 DIAGNOSIS — M6281 Muscle weakness (generalized): Secondary | ICD-10-CM | POA: Diagnosis not present

## 2023-08-03 DIAGNOSIS — N182 Chronic kidney disease, stage 2 (mild): Secondary | ICD-10-CM | POA: Diagnosis not present

## 2023-08-03 DIAGNOSIS — D649 Anemia, unspecified: Secondary | ICD-10-CM

## 2023-08-03 DIAGNOSIS — R2689 Other abnormalities of gait and mobility: Secondary | ICD-10-CM

## 2023-08-03 DIAGNOSIS — F0390 Unspecified dementia without behavioral disturbance: Secondary | ICD-10-CM | POA: Diagnosis not present

## 2023-08-03 DIAGNOSIS — K5900 Constipation, unspecified: Secondary | ICD-10-CM | POA: Diagnosis not present

## 2023-08-03 DIAGNOSIS — Z9181 History of falling: Secondary | ICD-10-CM

## 2023-09-03 ENCOUNTER — Encounter (HOSPITAL_COMMUNITY): Payer: Self-pay

## 2023-09-03 ENCOUNTER — Other Ambulatory Visit (HOSPITAL_COMMUNITY): Payer: Medicare Other

## 2023-09-03 ENCOUNTER — Emergency Department (HOSPITAL_COMMUNITY): Payer: Medicare Other

## 2023-09-03 ENCOUNTER — Emergency Department (HOSPITAL_COMMUNITY)
Admission: EM | Admit: 2023-09-03 | Discharge: 2023-09-03 | Disposition: A | Discharge status: Home / Self Care | Payer: Medicare Other | Attending: Emergency Medicine | Admitting: Emergency Medicine

## 2023-09-03 ENCOUNTER — Other Ambulatory Visit: Payer: Self-pay

## 2023-09-03 DIAGNOSIS — I499 Cardiac arrhythmia, unspecified: Secondary | ICD-10-CM | POA: Diagnosis not present

## 2023-09-03 DIAGNOSIS — Z7401 Bed confinement status: Secondary | ICD-10-CM | POA: Diagnosis not present

## 2023-09-03 DIAGNOSIS — I6782 Cerebral ischemia: Secondary | ICD-10-CM | POA: Diagnosis not present

## 2023-09-03 DIAGNOSIS — Z7901 Long term (current) use of anticoagulants: Secondary | ICD-10-CM | POA: Diagnosis not present

## 2023-09-03 DIAGNOSIS — I10 Essential (primary) hypertension: Secondary | ICD-10-CM | POA: Diagnosis not present

## 2023-09-03 DIAGNOSIS — Z79899 Other long term (current) drug therapy: Secondary | ICD-10-CM | POA: Insufficient documentation

## 2023-09-03 DIAGNOSIS — R6889 Other general symptoms and signs: Secondary | ICD-10-CM | POA: Diagnosis not present

## 2023-09-03 DIAGNOSIS — F0150 Vascular dementia without behavioral disturbance: Secondary | ICD-10-CM | POA: Diagnosis not present

## 2023-09-03 DIAGNOSIS — Z7982 Long term (current) use of aspirin: Secondary | ICD-10-CM | POA: Insufficient documentation

## 2023-09-03 DIAGNOSIS — R29818 Other symptoms and signs involving the nervous system: Secondary | ICD-10-CM | POA: Diagnosis not present

## 2023-09-03 DIAGNOSIS — R29810 Facial weakness: Secondary | ICD-10-CM | POA: Diagnosis not present

## 2023-09-03 DIAGNOSIS — Z743 Need for continuous supervision: Secondary | ICD-10-CM | POA: Diagnosis not present

## 2023-09-03 HISTORY — DX: Disorder of kidney and ureter, unspecified: N28.9

## 2023-09-03 HISTORY — DX: Unspecified abnormalities of breathing: R06.9

## 2023-09-03 HISTORY — DX: Essential (primary) hypertension: I10

## 2023-09-03 HISTORY — DX: Encephalopathy, unspecified: G93.40

## 2023-09-03 LAB — DIFFERENTIAL
Abs Immature Granulocytes: 0.03 10*3/uL (ref 0.00–0.07)
Basophils Absolute: 0 10*3/uL (ref 0.0–0.1)
Basophils Relative: 0 %
Eosinophils Absolute: 0.3 10*3/uL (ref 0.0–0.5)
Eosinophils Relative: 4 %
Immature Granulocytes: 0 %
Lymphocytes Relative: 12 %
Lymphs Abs: 1.1 10*3/uL (ref 0.7–4.0)
Monocytes Absolute: 0.9 10*3/uL (ref 0.1–1.0)
Monocytes Relative: 10 %
Neutro Abs: 6.7 10*3/uL (ref 1.7–7.7)
Neutrophils Relative %: 74 %

## 2023-09-03 LAB — COMPREHENSIVE METABOLIC PANEL
ALT: 13 U/L (ref 0–44)
AST: 15 U/L (ref 15–41)
Albumin: 2.9 g/dL — ABNORMAL LOW (ref 3.5–5.0)
Alkaline Phosphatase: 73 U/L (ref 38–126)
Anion gap: 7 (ref 5–15)
BUN: 19 mg/dL (ref 8–23)
CO2: 27 mmol/L (ref 22–32)
Calcium: 8.7 mg/dL — ABNORMAL LOW (ref 8.9–10.3)
Chloride: 104 mmol/L (ref 98–111)
Creatinine, Ser: 1.14 mg/dL (ref 0.61–1.24)
GFR, Estimated: >60 mL/min (ref >60)
Glucose, Bld: 103 mg/dL — ABNORMAL HIGH (ref 70–99)
Potassium: 4.2 mmol/L (ref 3.5–5.1)
Sodium: 138 mmol/L (ref 135–145)
Total Bilirubin: 0.5 mg/dL (ref 0.3–1.2)
Total Protein: 6.2 g/dL — ABNORMAL LOW (ref 6.5–8.1)

## 2023-09-03 LAB — URINALYSIS, ROUTINE W REFLEX MICROSCOPIC
Bilirubin Urine: NEGATIVE
Glucose, UA: NEGATIVE mg/dL
Hgb urine dipstick: NEGATIVE
Ketones, ur: NEGATIVE mg/dL
Leukocytes,Ua: NEGATIVE
Nitrite: NEGATIVE
Protein, ur: NEGATIVE mg/dL
Specific Gravity, Urine: 1.016 (ref 1.005–1.030)
pH: 6 (ref 5.0–8.0)

## 2023-09-03 LAB — CBC
HCT: 33.1 % — ABNORMAL LOW (ref 39.0–52.0)
Hemoglobin: 10.6 g/dL — ABNORMAL LOW (ref 13.0–17.0)
MCH: 29.6 pg (ref 26.0–34.0)
MCHC: 32 g/dL (ref 30.0–36.0)
MCV: 92.5 fL (ref 80.0–100.0)
Platelets: 222 10*3/uL (ref 150–400)
RBC: 3.58 MIL/uL — ABNORMAL LOW (ref 4.22–5.81)
RDW: 13.9 % (ref 11.5–15.5)
WBC: 9.1 10*3/uL (ref 4.0–10.5)
nRBC: 0 % (ref 0.0–0.2)

## 2023-09-03 LAB — I-STAT CHEM 8, ED
BUN: 22 mg/dL (ref 8–23)
Calcium, Ion: 1.19 mmol/L (ref 1.15–1.40)
Chloride: 103 mmol/L (ref 98–111)
Creatinine, Ser: 1.2 mg/dL (ref 0.61–1.24)
Glucose, Bld: 96 mg/dL (ref 70–99)
HCT: 32 % — ABNORMAL LOW (ref 39.0–52.0)
Hemoglobin: 10.9 g/dL — ABNORMAL LOW (ref 13.0–17.0)
Potassium: 4.2 mmol/L (ref 3.5–5.1)
Sodium: 140 mmol/L (ref 135–145)
TCO2: 27 mmol/L (ref 22–32)

## 2023-09-03 LAB — PROTIME-INR
INR: 3.4 — ABNORMAL HIGH (ref 0.8–1.2)
Prothrombin Time: 34.7 s — ABNORMAL HIGH (ref 11.4–15.2)

## 2023-09-03 LAB — RAPID URINE DRUG SCREEN, HOSP PERFORMED
Amphetamines: NOT DETECTED
Barbiturates: NOT DETECTED
Benzodiazepines: NOT DETECTED
Cocaine: NOT DETECTED
Opiates: NOT DETECTED
Tetrahydrocannabinol: NOT DETECTED

## 2023-09-03 LAB — APTT: aPTT: 44 s — ABNORMAL HIGH (ref 24–36)

## 2023-09-03 LAB — ETHANOL: Alcohol, Ethyl (B): <10 mg/dL (ref <10)

## 2023-09-03 MED ORDER — PREDNISONE 20 MG PO TABS
60.0000 mg | ORAL_TABLET | Freq: Once | ORAL | Status: AC
  Administered 2023-09-03: 60 mg via ORAL
  Filled 2023-09-03: qty 3

## 2023-09-03 MED ORDER — PREDNISONE 10 MG PO TABS: 60.0000 mg | ORAL_TABLET | Freq: Every day | ORAL | 0 refills | Status: AC

## 2023-09-03 NOTE — ED Provider Notes (Signed)
Accepted handoff at shift change from [facility] [place]. Please see prior provider note for more detail.   Briefly: Patient is 82 y.o. presenting for right facial droop.  History of vascular dementia, hypertension and atrial flutter.  DDX: concern for CVA, infection, metabolic derangement, Bell's palsy, etc   Plan: f/u on pending CT of the head and labs.  Possible neurology consult.  May consider MRI of the brain.  Assess for Bell's palsy as well.   Physical Exam  BP (!) 163/66 (BP Location: Left Arm)   Pulse 70   Temp 98.8 F (37.1 C) (Oral)   Resp 18   Ht 5\' 10"  (1.778 m)   Wt 86.2 kg   SpO2 99%   BMI 27.27 kg/m   Physical Exam  Procedures  Procedures  ED Course / MDM   Clinical Course as of 09/03/23 2147  Sat Sep 03, 2023  1501 Rt sided facial droop, LKW sometime this morning (or yesterday). Getting stroke work up. Consult Neuro. He is DNR/DNI.  [JR]    Clinical Course User Index [JR] Gareth Eagle, PA-C   Medical Decision Making Amount and/or Complexity of Data Reviewed Labs: ordered. Radiology: ordered.  Risk Prescription drug management.   No acute intracranial findings on CT head.  Did reassessed patient, still with notable right-sided facial droop with what appeared to be forehead involvement. Did initially order brain MRI but patient did not tolerated became physically combative. Discussed patient with Dr. Petra Kuba of neurology who advised that given reassuring CT of the head with facial paralysis involving the forehead, likely Bell's palsy if no other focal deficits and would be appropriate to forego the MRI brain at this time.  Patient also assessed from Dr. Jearld Fenton.  We mutually agreed that was difficult to assess from neurostandpoint given his vascular dementia.  Overall strength appeared to be grossly symmetric, patient was moving his extremities and facial nerves aside from the right sided facial droop appeared to be intact. Work up also does not  suggest infection. Had a shared decision making discussion with his son who is his legal guardian and was decided that he would like to forgo the MRI for now and return to his facility. Discussed strict return precautions. Discharged.       Gareth Eagle, PA-C 09/03/23 2159    Loetta Rough, MD 09/06/23 2125

## 2023-09-03 NOTE — ED Notes (Signed)
MD aware that pt did not make it through MRI

## 2023-09-03 NOTE — ED Notes (Signed)
Patient changed into clean linen and chux. Patient is comfortable at this time.

## 2023-09-03 NOTE — ED Triage Notes (Signed)
Unknown last known well.  Left sided droop and speech changes per ems 3 nurses gave 3 different last know well times.  Baseline dementia and baseline right drooling pt answers questions appropriately and follows commands

## 2023-09-03 NOTE — ED Notes (Signed)
Back from MRI they attempted and were not successful.  They stated he became agitated/combated and was attempting to hit them and pulled out his IV and would not lay flat  pt has not been able to lay flat he is contracted to the right side.  The NT and myself In and out calth him, cleaned, him turned him, changed him and the pt. Was not combative for [place] in any way.

## 2023-09-03 NOTE — ED Notes (Signed)
Will reassess the BP much higher then arrival and the pt is awkwardly lying in the bed

## 2023-09-03 NOTE — Discharge Instructions (Addendum)
Evaluation today revealed right-sided facial droop with possible forehead involvement this is consistent with Bell's palsy.  Starting him on prednisone.  Stroke cannot be definitively ruled out as it was difficult to assess patient from a neuro standpoint but feel that he is clinically well enough to turn to his facility.  If his symptoms change in any ways he can return for further evaluation.  Otherwise recommend follow-up with PCP.

## 2023-09-03 NOTE — ED Provider Notes (Signed)
[facility] EMERGENCY DEPARTMENT AT [facility] Provider Note   CSN: [id number] Arrival date & time: 09/03/23  1327     History  Chief Complaint  Patient presents with   Facial Droop    Alexander Bradley is a 82 y.o. male with a past medical history significant for hypertension, history of atrial flutter, vascular dementia, iron deficiency anemia who presents to the ED due to right sided facial droop.  Patient brought in from living facility.  Unclear patient's last known well.  Per nursing staff patient also had some speech changes.  Patient denies any weakness.  Recent admission for encephalopathy likely secondary to aspiration pneumonia, discharged on 7/15. Patient has no complaints during initial evaluation. Denies any pain. No headache. Denies any unilateral weakness.   Level 5 caveat secondary to history of dementia.  History obtained from patient, EMS, and past medical records. No interpreter used during encounter.       Home Medications Prior to Admission medications   Medication Sig Start Date End Date Taking? Authorizing Provider  amLODipine (NORVASC) 5 MG tablet Take 2.5 mg by mouth daily.    [provider]  aspirin 81 MG chewable tablet Chew 81 mg by mouth daily.    [provider]  atropine 1 % ophthalmic solution Place 1 drop under the tongue 3 (three) times daily. For excessive drooling    [provider]  Cholecalciferol (VITAMIN D3) 1000 units CAPS Take 1,000 Units by mouth daily.    [provider]  ferrous sulfate 325 (65 FE) MG EC tablet Take 1 tablet (325 mg total) by mouth daily with breakfast. 07/11/23 07/10/24  Morrie Sheldon, MD  losartan (COZAAR) 50 MG tablet Take 50 mg by mouth every evening.    [provider]  rivaroxaban (XARELTO) 20 MG TABS tablet Take 20 mg by mouth every evening.    [provider]  senna (SENOKOT) 8.6 MG TABS tablet Take 17.2 mg by mouth 2 (two) times daily.     [provider]  sertraline (ZOLOFT) 25 MG tablet Take 75 mg by mouth at bedtime.    [provider]      Allergies    Transderm-scop [scopolamine]    Review of Systems   Review of Systems  Unable to perform ROS: Dementia  Neurological:  Positive for facial asymmetry.    Physical Exam Updated Vital Signs BP 137/74 (BP Location: Left Arm)   Pulse 74   Temp 97.8 F (36.6 C) (Oral)   Resp 20   Ht 5\' 10"  (1.778 m)   Wt 86.2 kg   SpO2 100%   BMI 27.27 kg/m  Physical Exam Vitals and nursing note reviewed.  Constitutional:      General: He is not in acute distress.    Appearance: He is not ill-appearing.  HENT:     Head: Normocephalic.  Eyes:     Pupils: Pupils are equal, round, and reactive to light.  Cardiovascular:     Rate and Rhythm: Normal rate and regular rhythm.     Pulses: Normal pulses.     Heart sounds: Normal heart sounds. No murmur heard.    No friction rub. No gallop.  Pulmonary:     Effort: Pulmonary effort is normal.     Breath sounds: Normal breath sounds.  Abdominal:     General: Abdomen is flat. There is no distension.     Palpations: Abdomen is soft.     Tenderness: There is no abdominal tenderness.  There is no guarding or rebound.  Musculoskeletal:        General: Normal range of motion.     Cervical back: Neck supple.  Skin:    General: Skin is warm and dry.  Neurological:     General: No focal deficit present.     Mental Status: He is alert.     Comments: Right sided facial droop Equal grip strength 5/5 strength all 4 extremities No pronator drift Normal speech  Psychiatric:        Mood and Affect: Mood normal.        Behavior: Behavior normal.     ED Results / Procedures / Treatments   Labs (all labs ordered are listed, but only abnormal results are displayed) Labs Reviewed  ETHANOL  PROTIME-INR  APTT  CBC  DIFFERENTIAL  COMPREHENSIVE METABOLIC PANEL  RAPID URINE DRUG SCREEN, HOSP PERFORMED  URINALYSIS,  ROUTINE W REFLEX MICROSCOPIC  I-STAT CHEM 8, ED    EKG EKG Interpretation Date/Time:  Saturday September 03 2023 14:01:39 EDT Ventricular Rate:  70 PR Interval:  228 QRS Duration:  128 QT Interval:  454 QTC Calculation: 490 R Axis:   39  Text Interpretation: Sinus rhythm with 1st degree A-V block Right bundle branch block Possible Inferior infarct , age undetermined Abnormal ECG When compared with ECG of 07-Jul-2023 10:28, PREVIOUS ECG IS PRESENT Confirmed by Kristine Royal [phone]) on 09/03/2023 2:34:21 PM  Radiology No results found.  Procedures Procedures    Medications Ordered in ED Medications - No data to display  ED Course/ Medical Decision Making/ A&P Clinical Course as of 09/03/23 1506  Sat Sep 03, 2023  1501 Rt sided facial droop, LKW sometime this morning (or yesterday). Getting stroke work up. Consult Neuro. He is DNR/DNI.  [JR]    Clinical Course User Index [JR] Gareth Eagle, PA-C                                 Medical Decision Making Amount and/or Complexity of Data Reviewed Independent Historian: EMS External Data Reviewed: notes.    Details: Admission notes Labs: ordered. Decision-making details documented in ED Course. Radiology: ordered and independent interpretation performed. Decision-making details documented in ED Course. ECG/medicine tests: ordered and independent interpretation performed. Decision-making details documented in ED Course.   This patient presents to the ED for concern of right sided facial droop, this involves an extensive number of treatment options, and is a complaint that carries with it a high risk of complications and morbidity.  The differential diagnosis includes CVA, infection, metabolic derangement, Bell's palsy, etc  82 year old male presents to the ED due to right sided facial droop. Unclear last known well.  One living facilty staff member told EMS patient woke up with facial droop and another stated facial droop  started around 7AM. Patient has no complaints during initial evaluation.  History of dementia.  Does have slight right-sided facial droop.  5/5 strength to all 4 extremities.  No other neurological deficits. Normal speech.  Stroke labs ordered.  CT head. Patient out of TNK window. Dr. Rodena Medin evaluated patient at bedside during initial evaluation who agrees with assessment and plan.   Patient handed off to Riki Sheer, PA-C at shift change pending CT head and labs. Recommend discussing with neurology after CT head and labs result. MRI brain? Bells palsy?        Final Clinical Impression(s) / ED Diagnoses Final diagnoses:  Facial droop    Rx / DC Orders ED Discharge Orders     None         Jesusita Oka 09/03/23 1506    Wynetta Fines, MD 09/03/23 1616

## 2023-09-06 DIAGNOSIS — F0390 Unspecified dementia without behavioral disturbance: Secondary | ICD-10-CM | POA: Diagnosis not present

## 2023-09-06 DIAGNOSIS — Z86718 Personal history of other venous thrombosis and embolism: Secondary | ICD-10-CM | POA: Diagnosis not present

## 2023-09-06 DIAGNOSIS — I10 Essential (primary) hypertension: Secondary | ICD-10-CM | POA: Diagnosis not present

## 2023-09-06 DIAGNOSIS — G510 Bell's palsy: Secondary | ICD-10-CM | POA: Diagnosis not present

## 2023-09-06 DIAGNOSIS — N182 Chronic kidney disease, stage 2 (mild): Secondary | ICD-10-CM | POA: Diagnosis not present

## 2023-09-06 DIAGNOSIS — R2689 Other abnormalities of gait and mobility: Secondary | ICD-10-CM | POA: Diagnosis not present

## 2023-09-06 DIAGNOSIS — M6281 Muscle weakness (generalized): Secondary | ICD-10-CM | POA: Diagnosis not present

## 2023-09-06 DIAGNOSIS — Z9181 History of falling: Secondary | ICD-10-CM | POA: Diagnosis not present

## 2023-09-06 DIAGNOSIS — D649 Anemia, unspecified: Secondary | ICD-10-CM | POA: Diagnosis not present

## 2023-09-29 DIAGNOSIS — I4891 Unspecified atrial fibrillation: Secondary | ICD-10-CM | POA: Diagnosis not present

## 2023-09-29 DIAGNOSIS — I10 Essential (primary) hypertension: Secondary | ICD-10-CM | POA: Diagnosis not present

## 2023-09-29 DIAGNOSIS — F32A Depression, unspecified: Secondary | ICD-10-CM | POA: Diagnosis not present

## 2023-09-29 DIAGNOSIS — D649 Anemia, unspecified: Secondary | ICD-10-CM | POA: Diagnosis not present

## 2023-09-29 DIAGNOSIS — G301 Alzheimer's disease with late onset: Secondary | ICD-10-CM | POA: Diagnosis not present

## 2023-09-30 DIAGNOSIS — E119 Type 2 diabetes mellitus without complications: Secondary | ICD-10-CM | POA: Diagnosis not present

## 2023-09-30 DIAGNOSIS — D649 Anemia, unspecified: Secondary | ICD-10-CM | POA: Diagnosis not present

## 2023-10-03 DIAGNOSIS — E559 Vitamin D deficiency, unspecified: Secondary | ICD-10-CM | POA: Diagnosis not present

## 2023-10-05 DIAGNOSIS — L84 Corns and callosities: Secondary | ICD-10-CM | POA: Diagnosis not present

## 2023-10-05 DIAGNOSIS — L603 Nail dystrophy: Secondary | ICD-10-CM | POA: Diagnosis not present

## 2023-10-05 DIAGNOSIS — L602 Onychogryphosis: Secondary | ICD-10-CM | POA: Diagnosis not present

## 2023-10-05 DIAGNOSIS — I739 Peripheral vascular disease, unspecified: Secondary | ICD-10-CM | POA: Diagnosis not present

## 2023-10-10 DIAGNOSIS — G301 Alzheimer's disease with late onset: Secondary | ICD-10-CM | POA: Diagnosis not present

## 2023-10-18 DIAGNOSIS — H35013 Changes in retinal vascular appearance, bilateral: Secondary | ICD-10-CM | POA: Diagnosis not present

## 2023-10-20 DIAGNOSIS — E569 Vitamin deficiency, unspecified: Secondary | ICD-10-CM | POA: Diagnosis not present

## 2023-10-20 DIAGNOSIS — I10 Essential (primary) hypertension: Secondary | ICD-10-CM | POA: Diagnosis not present

## 2023-10-20 DIAGNOSIS — Z86718 Personal history of other venous thrombosis and embolism: Secondary | ICD-10-CM | POA: Diagnosis not present

## 2023-11-08 DIAGNOSIS — I10 Essential (primary) hypertension: Secondary | ICD-10-CM | POA: Diagnosis not present

## 2023-11-08 DIAGNOSIS — R051 Acute cough: Secondary | ICD-10-CM | POA: Diagnosis not present

## 2023-11-09 DIAGNOSIS — N390 Urinary tract infection, site not specified: Secondary | ICD-10-CM | POA: Diagnosis not present

## 2023-12-05 DIAGNOSIS — G301 Alzheimer's disease with late onset: Secondary | ICD-10-CM | POA: Diagnosis not present

## 2023-12-12 ENCOUNTER — Other Ambulatory Visit: Payer: Self-pay

## 2023-12-12 ENCOUNTER — Emergency Department (HOSPITAL_COMMUNITY)
Admission: EM | Admit: 2023-12-12 | Discharge: 2023-12-12 | Disposition: A | Discharge status: Home / Self Care | Payer: Medicare Other | Attending: Student | Admitting: Student

## 2023-12-12 ENCOUNTER — Encounter (HOSPITAL_COMMUNITY): Payer: Self-pay

## 2023-12-12 ENCOUNTER — Emergency Department (HOSPITAL_COMMUNITY): Payer: Medicare Other

## 2023-12-12 DIAGNOSIS — R5383 Other fatigue: Secondary | ICD-10-CM | POA: Diagnosis not present

## 2023-12-12 DIAGNOSIS — R0902 Hypoxemia: Secondary | ICD-10-CM | POA: Diagnosis not present

## 2023-12-12 DIAGNOSIS — Z743 Need for continuous supervision: Secondary | ICD-10-CM | POA: Diagnosis not present

## 2023-12-12 DIAGNOSIS — Z7901 Long term (current) use of anticoagulants: Secondary | ICD-10-CM | POA: Insufficient documentation

## 2023-12-12 DIAGNOSIS — I440 Atrioventricular block, first degree: Secondary | ICD-10-CM | POA: Diagnosis not present

## 2023-12-12 DIAGNOSIS — Z7982 Long term (current) use of aspirin: Secondary | ICD-10-CM | POA: Insufficient documentation

## 2023-12-12 DIAGNOSIS — I10 Essential (primary) hypertension: Secondary | ICD-10-CM | POA: Diagnosis not present

## 2023-12-12 DIAGNOSIS — R404 Transient alteration of awareness: Secondary | ICD-10-CM | POA: Diagnosis not present

## 2023-12-12 DIAGNOSIS — R531 Weakness: Secondary | ICD-10-CM | POA: Diagnosis not present

## 2023-12-12 DIAGNOSIS — U071 COVID-19: Secondary | ICD-10-CM | POA: Diagnosis not present

## 2023-12-12 DIAGNOSIS — R0689 Other abnormalities of breathing: Secondary | ICD-10-CM | POA: Diagnosis not present

## 2023-12-12 LAB — CBC WITH DIFFERENTIAL/PLATELET
Abs Immature Granulocytes: 0.06 10*3/uL (ref 0.00–0.07)
Basophils Absolute: 0 10*3/uL (ref 0.0–0.1)
Basophils Relative: 0 %
Eosinophils Absolute: 0.1 10*3/uL (ref 0.0–0.5)
Eosinophils Relative: 1 %
HCT: 36 % — ABNORMAL LOW (ref 39.0–52.0)
Hemoglobin: 11.6 g/dL — ABNORMAL LOW (ref 13.0–17.0)
Immature Granulocytes: 1 %
Lymphocytes Relative: 7 %
Lymphs Abs: 0.9 10*3/uL (ref 0.7–4.0)
MCH: 31.3 pg (ref 26.0–34.0)
MCHC: 32.2 g/dL (ref 30.0–36.0)
MCV: 97 fL (ref 80.0–100.0)
Monocytes Absolute: 0.8 10*3/uL (ref 0.1–1.0)
Monocytes Relative: 6 %
Neutro Abs: 11.2 10*3/uL — ABNORMAL HIGH (ref 1.7–7.7)
Neutrophils Relative %: 85 %
Platelets: 214 10*3/uL (ref 150–400)
RBC: 3.71 MIL/uL — ABNORMAL LOW (ref 4.22–5.81)
RDW: 13.8 % (ref 11.5–15.5)
WBC: 13.1 10*3/uL — ABNORMAL HIGH (ref 4.0–10.5)
nRBC: 0 % (ref 0.0–0.2)

## 2023-12-12 LAB — COMPREHENSIVE METABOLIC PANEL
ALT: 15 U/L (ref 0–44)
AST: 17 U/L (ref 15–41)
Albumin: 3.1 g/dL — ABNORMAL LOW (ref 3.5–5.0)
Alkaline Phosphatase: 69 U/L (ref 38–126)
Anion gap: 7 (ref 5–15)
BUN: 23 mg/dL (ref 8–23)
CO2: 25 mmol/L (ref 22–32)
Calcium: 8.6 mg/dL — ABNORMAL LOW (ref 8.9–10.3)
Chloride: 106 mmol/L (ref 98–111)
Creatinine, Ser: 1.02 mg/dL (ref 0.61–1.24)
GFR, Estimated: >60 mL/min (ref >60)
Glucose, Bld: 97 mg/dL (ref 70–99)
Potassium: 3.9 mmol/L (ref 3.5–5.1)
Sodium: 138 mmol/L (ref 135–145)
Total Bilirubin: 1 mg/dL (ref <1.2)
Total Protein: 6.4 g/dL — ABNORMAL LOW (ref 6.5–8.1)

## 2023-12-12 LAB — LACTIC ACID, PLASMA
Lactic Acid, Venous: 0.9 mmol/L (ref 0.5–1.9)
Lactic Acid, Venous: 0.6 mmol/L (ref 0.5–1.9)

## 2023-12-12 MED ORDER — AZITHROMYCIN 250 MG PO TABS: 250.0000 mg | ORAL_TABLET | Freq: Every day | ORAL | 0 refills | Status: DC

## 2023-12-12 MED ORDER — SODIUM CHLORIDE 0.9 % IV BOLUS
500.0000 mL | Freq: Once | INTRAVENOUS | Status: AC
  Administered 2023-12-12: 500 mL via INTRAVENOUS

## 2023-12-12 MED ORDER — AZITHROMYCIN 250 MG PO TABS
500.0000 mg | ORAL_TABLET | Freq: Once | ORAL | Status: AC
  Administered 2023-12-12: 500 mg via ORAL
  Filled 2023-12-12: qty 2

## 2023-12-12 MED ORDER — AMOXICILLIN-POT CLAVULANATE 875-125 MG PO TABS: 1.0000 | ORAL_TABLET | Freq: Two times a day (BID) | ORAL | 0 refills | Status: DC

## 2023-12-12 MED ORDER — SODIUM CHLORIDE 0.9 % IV SOLN
1.0000 g | Freq: Once | INTRAVENOUS | Status: AC
  Administered 2023-12-12: 1 g via INTRAVENOUS
  Filled 2023-12-12: qty 10

## 2023-12-12 NOTE — Discharge Instructions (Signed)
Please follow-up with your primary care doctor, return if you feel like your symptoms are worsening.  Your chest x-ray here today is reassuring however we are going to prophylactically treat you for pneumonia, given the severity of your wet cough, and your age.  Return if your symptoms are worsening.  I have attempted to get a hold of your facility, multiple times, but they have not answered, if there are worsening symptoms, or concerns please return to the ER

## 2023-12-12 NOTE — ED Notes (Signed)
[facility] called for transport.  

## 2023-12-12 NOTE — ED Notes (Signed)
Attempted to contact facility for report.

## 2023-12-12 NOTE — ED Provider Notes (Signed)
[place] EMERGENCY DEPARTMENT AT [facility] Provider Note   CSN: [id number] Arrival date & time: 12/12/23  1455     History  Chief Complaint  Patient presents with   Fatigue    Alexander Bradley is a 82 y.o. 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  History is limited, given that facility is not answering the phone.  Per son, patient has had symptoms for the last couple days, and got COVID from his roommate.  He states he is fairly at baseline, maybe slightly a little bit more confused, but overall reassuring.    Home Medications Prior to Admission medications   Medication Sig Start Date End Date Taking? Authorizing Provider  amoxicillin-clavulanate (AUGMENTIN) 875-125 MG tablet Take 1 tablet by mouth every 12 (twelve) hours. 12/12/23  Yes Joaquin Knebel L, PA  amLODipine (NORVASC) 5 MG tablet Take 2.5 mg by mouth daily.    [provider]  aspirin 81 MG chewable tablet Chew 81 mg by mouth daily.    [provider]  atropine 1 % ophthalmic solution Place 1 drop under the tongue 3 (three) times daily. For excessive drooling    [provider]  azithromycin (ZITHROMAX) 250 MG tablet Take 1 tablet (250 mg total) by mouth daily. 12/12/23   Augusta Mirkin L, PA  Cholecalciferol (VITAMIN D3) 1000 units CAPS Take 1,000 Units by mouth daily.    [provider]  ferrous sulfate 325 (65 FE) MG EC tablet Take 1 tablet (325 mg total) by mouth daily with breakfast. 07/11/23 07/10/24  Morrie Sheldon, MD  losartan (COZAAR) 50 MG tablet Take 50 mg by mouth every evening.    [provider]  rivaroxaban (XARELTO) 20 MG TABS tablet Take 20 mg by mouth every evening.    [provider]  senna (SENOKOT) 8.6 MG TABS tablet Take 17.2 mg by mouth 2 (two) times daily.    [provider]  sertraline (ZOLOFT) 25 MG tablet Take 75 mg by mouth at bedtime.     [provider]      Allergies    Transderm-scop [scopolamine]    Review of Systems   Review of Systems  Reason unable to perform ROS: pt severely demented.  Constitutional:  Negative for fever.  Respiratory:  Positive for cough.     Physical Exam Updated Vital Signs BP (!) 159/78   Pulse (!) 59   Temp 98.2 F (36.8 C) (Oral)   Resp 14   Ht 5\' 10"  (1.778 m)   Wt 86.2 kg   SpO2 100%   BMI 27.27 kg/m  Physical Exam Vitals and nursing note reviewed.  Constitutional:      General: He is not in acute distress.    Appearance: He is well-developed.     Comments: Chronically ill-appearing  HENT:     Head: Normocephalic and atraumatic.     Mouth/Throat:     Mouth: Mucous membranes are dry.  Eyes:     Conjunctiva/sclera: Conjunctivae normal.  Cardiovascular:     Rate and Rhythm: Normal rate and regular rhythm.     Heart sounds: No murmur heard. Pulmonary:     Effort: Pulmonary effort is normal. No respiratory distress.     Comments: Diminished breath sounds, possibly secondary to hypoventilation?,  Wet cough Abdominal:     Palpations: Abdomen is soft.     Tenderness: There is no abdominal tenderness.  Musculoskeletal:  General: No swelling.     Cervical back: Neck supple.  Skin:    General: Skin is warm and dry.     Capillary Refill: Capillary refill takes less than 2 seconds.  Neurological:     Mental Status: Mental status is at baseline.  Psychiatric:        Mood and Affect: Mood normal.     ED Results / Procedures / Treatments   Labs (all labs ordered are listed, but only abnormal results are displayed) Labs Reviewed  CBC WITH DIFFERENTIAL/PLATELET - Abnormal; Notable for the following components:      Result Value   WBC 13.1 (*)    RBC 3.71 (*)    Hemoglobin 11.6 (*)    HCT 36.0 (*)    Neutro Abs 11.2 (*)    All other components within normal limits  COMPREHENSIVE METABOLIC PANEL - Abnormal; Notable for the following components:    Calcium 8.6 (*)    Total Protein 6.4 (*)    Albumin 3.1 (*)    All other components within normal limits  LACTIC ACID, PLASMA  LACTIC ACID, PLASMA    EKG None  Radiology DG Chest Portable 1 View Result Date: 12/12/2023 CLINICAL DATA:  Weakness. Lethargy. COVID-19 positive. Dementia and encephalopathy. EXAM: PORTABLE CHEST 1 VIEW COMPARISON:  07/07/2023 FINDINGS: The patient's chin is overlying the right lung apex in the majority of the left upper lobe. The visualized lungs are clear. Normal sized heart. No acute bony abnormality. Cholecystectomy clips. IMPRESSION: Limited examination with no visible acute abnormality. Electronically Signed   By: Beckie Salts M.D.   On: 12/12/2023 17:05    Procedures Procedures    Medications Ordered in ED Medications  sodium chloride 0.9 % bolus 500 mL (0 mLs Intravenous Stopped 12/12/23 1934)  cefTRIAXone (ROCEPHIN) 1 g in sodium chloride 0.9 % 100 mL IVPB (0 g Intravenous Stopped 12/12/23 1935)  azithromycin (ZITHROMAX) tablet 500 mg (500 mg Oral Given 12/12/23 1934)    ED Course/ Medical Decision Making/ A&P                                 Medical Decision Making Patient is an 82 year old male, history encephalopathy, severe dementia, who presents to the ED secondary to increased lethargy, [facility].  History is limited Given patient's severe dementia, I attempted to call [facility], without any answer.  Appears that he tested positive for COVID this a.m.  He appears confused, and dry on exam.  Generalized illness.  We will obtain chest x-ray, lactic acid, blood work for further evaluation as well as urinalysis.  Give patient fluids as he appears clinically dry.  Amount and/or Complexity of Data Reviewed Labs: ordered.    Details: Leukocytosis of 13K Radiology: ordered.    Details: Chest x-ray clear Discussion of management or test interpretation with external provider(s): I spoke with his son Alexander Bradley, at bedside, he states  his father is a fairly at baseline, may be slightly a little bit more confused.  He is chronically ill appearing, however but not hypoxic.  I discussed admission, versus discharge, with his POA, bury his son, and very would like him to be discharged.  He declined any kind of Paxlovid, or COVID treatment.  Is okay with empiric antibiotics, given his advanced age, poor positioning of the chest x-ray.  Will go ahead and treat him for possible pneumonia, with atypical coverage with azithromycin and otherwise Augmentin.  He is not tachypneic, not tachycardic.  Discussed with the son at bedside, he is agreement with plan.  Discharged home to facility.  Attempted to get a hold of facility 3 or 4 times without any answer for further collateral information.  Risk Prescription drug management.   Final Clinical Impression(s) / ED Diagnoses Final diagnoses:  COVID-19    Rx / DC Orders ED Discharge Orders          Ordered    amoxicillin-clavulanate (AUGMENTIN) 875-125 MG tablet  Every 12 hours        12/12/23 2005    azithromycin (ZITHROMAX) 250 MG tablet  Daily,   Status:  Discontinued        12/12/23 2005    azithromycin (ZITHROMAX) 250 MG tablet  Daily        12/12/23 2006              Pete Pelt, [place] 12/12/23 2019    Glendora Score, MD 12/13/23 1156

## 2023-12-12 NOTE — ED Notes (Signed)
Attempt #2 for report. Message left

## 2023-12-12 NOTE — ED Triage Notes (Signed)
BIB EMS from [facility] for lethargy. Pt tested positive for covid this AM. Dementia and encephalopathy at baseline. Pt lethargic and opens eyes to stimulation only.

## 2023-12-12 NOTE — ED Notes (Signed)
Attempt #3 for report message left.

## 2023-12-19 DIAGNOSIS — J9601 Acute respiratory failure with hypoxia: Secondary | ICD-10-CM | POA: Diagnosis not present

## 2024-01-04 DIAGNOSIS — Z9181 History of falling: Secondary | ICD-10-CM

## 2024-01-04 DIAGNOSIS — R040 Epistaxis: Secondary | ICD-10-CM

## 2024-01-04 DIAGNOSIS — G301 Alzheimer's disease with late onset: Secondary | ICD-10-CM

## 2024-01-04 DIAGNOSIS — I4891 Unspecified atrial fibrillation: Secondary | ICD-10-CM | POA: Diagnosis not present

## 2024-01-04 DIAGNOSIS — I10 Essential (primary) hypertension: Secondary | ICD-10-CM | POA: Diagnosis not present

## 2024-01-04 DIAGNOSIS — F32A Depression, unspecified: Secondary | ICD-10-CM | POA: Diagnosis not present

## 2024-01-04 DIAGNOSIS — D649 Anemia, unspecified: Secondary | ICD-10-CM | POA: Diagnosis not present

## 2024-01-05 DIAGNOSIS — I10 Essential (primary) hypertension: Secondary | ICD-10-CM | POA: Diagnosis not present

## 2024-01-05 DIAGNOSIS — D649 Anemia, unspecified: Secondary | ICD-10-CM

## 2024-01-05 DIAGNOSIS — F32A Depression, unspecified: Secondary | ICD-10-CM | POA: Diagnosis not present

## 2024-01-05 DIAGNOSIS — G301 Alzheimer's disease with late onset: Secondary | ICD-10-CM | POA: Diagnosis not present

## 2024-01-05 DIAGNOSIS — I4891 Unspecified atrial fibrillation: Secondary | ICD-10-CM | POA: Diagnosis not present

## 2024-01-05 DIAGNOSIS — R040 Epistaxis: Secondary | ICD-10-CM

## 2024-01-27 DIAGNOSIS — D649 Anemia, unspecified: Secondary | ICD-10-CM | POA: Diagnosis not present

## 2024-01-27 DIAGNOSIS — G301 Alzheimer's disease with late onset: Secondary | ICD-10-CM | POA: Diagnosis not present

## 2024-03-27 DIAGNOSIS — L602 Onychogryphosis: Secondary | ICD-10-CM | POA: Diagnosis not present

## 2024-03-27 DIAGNOSIS — I739 Peripheral vascular disease, unspecified: Secondary | ICD-10-CM | POA: Diagnosis not present

## 2024-03-28 DIAGNOSIS — R1312 Dysphagia, oropharyngeal phase: Secondary | ICD-10-CM | POA: Diagnosis not present

## 2024-03-30 DIAGNOSIS — R1312 Dysphagia, oropharyngeal phase: Secondary | ICD-10-CM | POA: Diagnosis not present

## 2024-04-04 DIAGNOSIS — Z86718 Personal history of other venous thrombosis and embolism: Secondary | ICD-10-CM | POA: Diagnosis not present

## 2024-04-04 DIAGNOSIS — R1312 Dysphagia, oropharyngeal phase: Secondary | ICD-10-CM | POA: Diagnosis not present

## 2024-04-05 DIAGNOSIS — Z86718 Personal history of other venous thrombosis and embolism: Secondary | ICD-10-CM | POA: Diagnosis not present

## 2024-04-05 DIAGNOSIS — G9340 Encephalopathy, unspecified: Secondary | ICD-10-CM | POA: Diagnosis not present

## 2024-04-05 DIAGNOSIS — I10 Essential (primary) hypertension: Secondary | ICD-10-CM | POA: Diagnosis not present

## 2024-04-09 DIAGNOSIS — R1312 Dysphagia, oropharyngeal phase: Secondary | ICD-10-CM | POA: Diagnosis not present

## 2024-04-12 DIAGNOSIS — R1312 Dysphagia, oropharyngeal phase: Secondary | ICD-10-CM | POA: Diagnosis not present

## 2024-04-13 DIAGNOSIS — H35013 Changes in retinal vascular appearance, bilateral: Secondary | ICD-10-CM | POA: Diagnosis not present

## 2024-04-16 DIAGNOSIS — R1312 Dysphagia, oropharyngeal phase: Secondary | ICD-10-CM | POA: Diagnosis not present

## 2024-04-23 DIAGNOSIS — G301 Alzheimer's disease with late onset: Secondary | ICD-10-CM | POA: Diagnosis not present

## 2024-04-27 DIAGNOSIS — R319 Hematuria, unspecified: Secondary | ICD-10-CM | POA: Diagnosis not present

## 2024-04-27 DIAGNOSIS — R41841 Cognitive communication deficit: Secondary | ICD-10-CM | POA: Diagnosis not present

## 2024-04-28 DIAGNOSIS — I10 Essential (primary) hypertension: Secondary | ICD-10-CM | POA: Diagnosis not present

## 2024-04-28 DIAGNOSIS — R509 Fever, unspecified: Secondary | ICD-10-CM | POA: Diagnosis not present

## 2024-05-22 DIAGNOSIS — G309 Alzheimer's disease, unspecified: Secondary | ICD-10-CM | POA: Diagnosis not present

## 2024-06-07 DIAGNOSIS — I739 Peripheral vascular disease, unspecified: Secondary | ICD-10-CM | POA: Diagnosis not present

## 2024-06-07 DIAGNOSIS — L602 Onychogryphosis: Secondary | ICD-10-CM | POA: Diagnosis not present

## 2024-06-18 DIAGNOSIS — G309 Alzheimer's disease, unspecified: Secondary | ICD-10-CM | POA: Diagnosis not present

## 2024-07-16 DIAGNOSIS — G301 Alzheimer's disease with late onset: Secondary | ICD-10-CM | POA: Diagnosis not present

## 2024-07-16 DIAGNOSIS — N611 Abscess of the breast and nipple: Secondary | ICD-10-CM | POA: Diagnosis not present

## 2024-08-08 DIAGNOSIS — L602 Onychogryphosis: Secondary | ICD-10-CM | POA: Diagnosis not present

## 2024-08-08 DIAGNOSIS — I739 Peripheral vascular disease, unspecified: Secondary | ICD-10-CM | POA: Diagnosis not present

## 2024-08-13 DIAGNOSIS — G301 Alzheimer's disease with late onset: Secondary | ICD-10-CM | POA: Diagnosis not present

## 2024-08-14 DIAGNOSIS — M6281 Muscle weakness (generalized): Secondary | ICD-10-CM | POA: Diagnosis not present

## 2024-08-14 DIAGNOSIS — G9340 Encephalopathy, unspecified: Secondary | ICD-10-CM | POA: Diagnosis not present

## 2024-08-14 DIAGNOSIS — R278 Other lack of coordination: Secondary | ICD-10-CM | POA: Diagnosis not present

## 2024-08-24 DIAGNOSIS — I10 Essential (primary) hypertension: Secondary | ICD-10-CM | POA: Diagnosis not present

## 2024-08-24 DIAGNOSIS — I82409 Acute embolism and thrombosis of unspecified deep veins of unspecified lower extremity: Secondary | ICD-10-CM | POA: Diagnosis not present

## 2024-08-24 DIAGNOSIS — F339 Major depressive disorder, recurrent, unspecified: Secondary | ICD-10-CM | POA: Diagnosis not present

## 2024-08-27 ENCOUNTER — Emergency Department (HOSPITAL_COMMUNITY): Admission: EM | Admit: 2024-08-27 | Discharge: 2024-08-27 | Disposition: A | Discharge status: Home / Self Care

## 2024-08-27 ENCOUNTER — Other Ambulatory Visit: Payer: Self-pay

## 2024-08-27 DIAGNOSIS — N390 Urinary tract infection, site not specified: Secondary | ICD-10-CM | POA: Insufficient documentation

## 2024-08-27 DIAGNOSIS — F0390 Unspecified dementia without behavioral disturbance: Secondary | ICD-10-CM | POA: Diagnosis not present

## 2024-08-27 DIAGNOSIS — Z7901 Long term (current) use of anticoagulants: Secondary | ICD-10-CM | POA: Diagnosis not present

## 2024-08-27 DIAGNOSIS — Z7401 Bed confinement status: Secondary | ICD-10-CM | POA: Diagnosis not present

## 2024-08-27 DIAGNOSIS — Z7982 Long term (current) use of aspirin: Secondary | ICD-10-CM | POA: Insufficient documentation

## 2024-08-27 DIAGNOSIS — K625 Hemorrhage of anus and rectum: Secondary | ICD-10-CM | POA: Diagnosis not present

## 2024-08-27 DIAGNOSIS — Z743 Need for continuous supervision: Secondary | ICD-10-CM | POA: Diagnosis not present

## 2024-08-27 DIAGNOSIS — Z130 Encounter for screening for diseases of the blood and blood-forming organs and certain disorders involving the immune mechanism: Secondary | ICD-10-CM | POA: Diagnosis not present

## 2024-08-27 DIAGNOSIS — I10 Essential (primary) hypertension: Secondary | ICD-10-CM | POA: Diagnosis not present

## 2024-08-27 DIAGNOSIS — R58 Hemorrhage, not elsewhere classified: Secondary | ICD-10-CM | POA: Diagnosis not present

## 2024-08-27 LAB — CBC WITH DIFFERENTIAL/PLATELET
Abs Immature Granulocytes: 0.02 K/uL (ref 0.00–0.07)
Basophils Absolute: 0 K/uL (ref 0.0–0.1)
Basophils Relative: 1 %
Eosinophils Absolute: 0.3 K/uL (ref 0.0–0.5)
Eosinophils Relative: 5 %
HCT: 32.2 % — ABNORMAL LOW (ref 39.0–52.0)
Hemoglobin: 10.3 g/dL — ABNORMAL LOW (ref 13.0–17.0)
Immature Granulocytes: 0 %
Lymphocytes Relative: 12 %
Lymphs Abs: 0.7 K/uL (ref 0.7–4.0)
MCH: 30.5 pg (ref 26.0–34.0)
MCHC: 32 g/dL (ref 30.0–36.0)
MCV: 95.3 fL (ref 80.0–100.0)
Monocytes Absolute: 0.8 K/uL (ref 0.1–1.0)
Monocytes Relative: 14 %
Neutro Abs: 4.1 K/uL (ref 1.7–7.7)
Neutrophils Relative %: 68 %
Platelets: 266 K/uL (ref 150–400)
RBC: 3.38 MIL/uL — ABNORMAL LOW (ref 4.22–5.81)
RDW: 14.1 % (ref 11.5–15.5)
WBC: 5.9 K/uL (ref 4.0–10.5)
nRBC: 0 % (ref 0.0–0.2)

## 2024-08-27 LAB — URINALYSIS, ROUTINE W REFLEX MICROSCOPIC
Bilirubin Urine: NEGATIVE
Glucose, UA: NEGATIVE mg/dL
Hgb urine dipstick: NEGATIVE
Ketones, ur: NEGATIVE mg/dL
Nitrite: NEGATIVE
Protein, ur: 30 mg/dL — AB
Specific Gravity, Urine: 1.02 (ref 1.005–1.030)
WBC, UA: >50 WBC/hpf (ref 0–5)
pH: 7 (ref 5.0–8.0)

## 2024-08-27 LAB — BASIC METABOLIC PANEL WITH GFR
Anion gap: 8 (ref 5–15)
BUN: 34 mg/dL — ABNORMAL HIGH (ref 8–23)
CO2: 24 mmol/L (ref 22–32)
Calcium: 8.2 mg/dL — ABNORMAL LOW (ref 8.9–10.3)
Chloride: 109 mmol/L (ref 98–111)
Creatinine, Ser: 1.31 mg/dL — ABNORMAL HIGH (ref 0.61–1.24)
GFR, Estimated: 53 mL/min — ABNORMAL LOW (ref >60)
Glucose, Bld: 114 mg/dL — ABNORMAL HIGH (ref 70–99)
Potassium: 3.7 mmol/L (ref 3.5–5.1)
Sodium: 141 mmol/L (ref 135–145)

## 2024-08-27 MED ORDER — CEPHALEXIN 500 MG PO CAPS: 500.0000 mg | ORAL_CAPSULE | Freq: Four times a day (QID) | ORAL | 0 refills | Status: AC

## 2024-08-27 MED ORDER — CEPHALEXIN 250 MG PO CAPS
500.0000 mg | ORAL_CAPSULE | Freq: Once | ORAL | Status: AC
  Administered 2024-08-27: 500 mg via ORAL
  Filled 2024-08-27: qty 2

## 2024-08-27 NOTE — ED Provider Notes (Signed)
 [place] EMERGENCY DEPARTMENT AT [facility] Provider Note   CSN: [id number] Arrival date & time: 08/27/24  1036     Patient presents with: Rectal Bleeding   Alexander Bradley is a 83 y.o. male.   83 year old male brought in by ambulance from SNF for evaluation of rectal bleeding.  Per EMS nursing staff states he had some blood clots from his rectum earlier today.  Patient's vitals are stable.  He is a poor historian but states he has some suprapubic tenderness.  Denies any other symptoms or concerns at this time.   Rectal Bleeding Associated symptoms: no abdominal pain, no fever and no vomiting        Prior to Admission medications   Medication Sig Start Date End Date Taking? Authorizing Provider  cephALEXin  (KEFLEX ) 500 MG capsule Take 1 capsule (500 mg total) by mouth 4 (four) times daily for 7 days. 08/27/24 09/03/24 Yes Koda Routon L, DO  amLODipine (NORVASC) 5 MG tablet Take 2.5 mg by mouth daily.    [provider]  amoxicillin -clavulanate (AUGMENTIN ) 875-125 MG tablet Take 1 tablet by mouth every 12 (twelve) hours. 12/12/23   Small, Brooke L, PA  aspirin 81 MG chewable tablet Chew 81 mg by mouth daily.    [provider]  atropine 1 % ophthalmic solution Place 1 drop under the tongue 3 (three) times daily. For excessive drooling    [provider]  azithromycin  (ZITHROMAX ) 250 MG tablet Take 1 tablet (250 mg total) by mouth daily. 12/12/23   Small, Brooke L, PA  Cholecalciferol (VITAMIN D3) 1000 units CAPS Take 1,000 Units by mouth daily.    [provider]  ferrous sulfate  325 (65 FE) MG EC tablet Take 1 tablet (325 mg total) by mouth daily with breakfast. 07/11/23 07/10/24  Stephanie Freund, MD  losartan (COZAAR) 50 MG tablet Take 50 mg by mouth every evening.    [provider]  rivaroxaban (XARELTO) 20 MG TABS tablet Take 20 mg by mouth every evening.    [provider]  senna (SENOKOT) 8.6 MG TABS tablet Take  17.2 mg by mouth 2 (two) times daily.    [provider]  sertraline (ZOLOFT) 25 MG tablet Take 75 mg by mouth at bedtime.    [provider]    Allergies: Transderm-scop [scopolamine]    Review of Systems  Constitutional:  Negative for chills and fever.  HENT:  Negative for ear pain and sore throat.   Eyes:  Negative for pain and visual disturbance.  Respiratory:  Negative for cough and shortness of breath.   Cardiovascular:  Negative for chest pain and palpitations.  Gastrointestinal:  Positive for hematochezia. Negative for abdominal pain and vomiting.  Genitourinary:  Negative for dysuria and hematuria.  Musculoskeletal:  Negative for arthralgias and back pain.  Skin:  Negative for color change and rash.  Neurological:  Negative for seizures and syncope.  All other systems reviewed and are negative.   Updated Vital Signs BP (!) 154/78   Pulse 74   Temp 97.9 F (36.6 C) (Oral)   Resp 15   Ht 5' 10 (1.778 m)   Wt 86.2 kg   SpO2 100%   BMI 27.27 kg/m   Physical Exam Vitals and nursing note reviewed.  Constitutional:      General: He is not in acute distress.    Appearance: Normal appearance. He is well-developed. He is not ill-appearing.  HENT:     Head: Normocephalic and atraumatic.  Eyes:  Conjunctiva/sclera: Conjunctivae normal.  Cardiovascular:     Rate and Rhythm: Normal rate and regular rhythm.     Heart sounds: No murmur heard. Pulmonary:     Effort: Pulmonary effort is normal. No respiratory distress.     Breath sounds: Normal breath sounds. No stridor. No wheezing or rhonchi.  Abdominal:     Palpations: Abdomen is soft.     Tenderness: There is abdominal tenderness.     Comments: Mild suprapubic tenderness to palpation  Musculoskeletal:        General: No swelling.     Cervical back: Neck supple.  Skin:    General: Skin is warm and dry.     Capillary Refill: Capillary refill takes less than 2 seconds.  Neurological:     Mental  Status: He is alert.  Psychiatric:        Mood and Affect: Mood normal.     (all labs ordered are listed, but only abnormal results are displayed) Labs Reviewed  BASIC METABOLIC PANEL WITH GFR - Abnormal; Notable for the following components:      Result Value   Glucose, Bld 114 (*)    BUN 34 (*)    Creatinine, Ser 1.31 (*)    Calcium 8.2 (*)    GFR, Estimated 53 (*)    All other components within normal limits  CBC WITH DIFFERENTIAL/PLATELET - Abnormal; Notable for the following components:   RBC 3.38 (*)    Hemoglobin 10.3 (*)    HCT 32.2 (*)    All other components within normal limits  URINALYSIS, ROUTINE W REFLEX MICROSCOPIC - Abnormal; Notable for the following components:   APPearance CLOUDY (*)    Protein, ur 30 (*)    Leukocytes,Ua LARGE (*)    Bacteria, UA RARE (*)    All other components within normal limits    EKG: None  Radiology: No results found.   Procedures   Medications Ordered in the ED  cephALEXin  (KEFLEX ) capsule 500 mg (has no administration in time range)                                    Medical Decision Making Cardiac monitor interpretation: Sinus rhythm, no ectopy  Social determinants of health: Patient has dementia, lives in a nursing facility  Patient here for rectal bleeding.  Stool here is brown although he does have some diarrhea.  He has some anemia but hemoglobin is fairly stable he does not require transfusion at this time.  He does have evidence of mild UTI.  He is having some suprapubic pain and dysuria as well.  Will start him on Keflex  he was given his first dose here.  Advise close follow-up with primary care and otherwise return to the ER for new or worsening symptoms.  Patient will be discharged back to the nursing home.  Problems Addressed: Rectal bleeding: acute illness or injury Urinary tract infection without hematuria, site unspecified: acute illness or injury  Amount and/or Complexity of Data Reviewed Independent  Historian: EMS    Details: History obtained from EMS regarding patient's rectal bleeding and report from nursing home External Data Reviewed: notes.    Details: Prior ED records reviewed and patient was seen in December of last year for COVID Labs: ordered. Decision-making details documented in ED Course.    Details: Ordered and reviewed by me and patient has some anemia but hemoglobin fairly stable, he does have  evidence of a UTI  Risk OTC drugs. Prescription drug management. Drug therapy requiring intensive monitoring for toxicity. Diagnosis or treatment significantly limited by social determinants of health.     Final diagnoses:  Urinary tract infection without hematuria, site unspecified  Rectal bleeding    ED Discharge Orders          Ordered    cephALEXin  (KEFLEX ) 500 MG capsule  4 times daily        08/27/24 1533               Audery Wassenaar L, DO 08/27/24 1534

## 2024-08-27 NOTE — ED Notes (Signed)
 [facility] here to transport pt back to facility. Son at bedside. PT and son verbalized understanding of DC instructions.Pt amb out of ED with all belongings

## 2024-08-27 NOTE — ED Triage Notes (Signed)
 Pt bib [facility] from [facility] SNF for rectal bleeding.  Nurse at facility reports noting clots. Pt is not on thinners.

## 2024-08-27 NOTE — ED Notes (Signed)
 Cleaned pt and placed in new brief one episode urine and stool.

## 2024-08-27 NOTE — Discharge Instructions (Addendum)
 Continue to monitor for bleeding.  Patient had no bleeding in the ER and stool was brown.  Take your antibiotics as prescribed.  Follow-up with your primary care doctor next week.  Return to the ER for new or worsening symptoms.

## 2024-08-28 DIAGNOSIS — R278 Other lack of coordination: Secondary | ICD-10-CM | POA: Diagnosis not present

## 2024-08-28 DIAGNOSIS — M6281 Muscle weakness (generalized): Secondary | ICD-10-CM | POA: Diagnosis not present

## 2024-08-28 DIAGNOSIS — G9340 Encephalopathy, unspecified: Secondary | ICD-10-CM | POA: Diagnosis not present

## 2024-08-28 DIAGNOSIS — R1312 Dysphagia, oropharyngeal phase: Secondary | ICD-10-CM | POA: Diagnosis not present

## 2024-08-29 DIAGNOSIS — M6281 Muscle weakness (generalized): Secondary | ICD-10-CM | POA: Diagnosis not present

## 2024-08-29 DIAGNOSIS — R1312 Dysphagia, oropharyngeal phase: Secondary | ICD-10-CM | POA: Diagnosis not present

## 2024-08-29 DIAGNOSIS — R278 Other lack of coordination: Secondary | ICD-10-CM | POA: Diagnosis not present

## 2024-08-29 DIAGNOSIS — G9340 Encephalopathy, unspecified: Secondary | ICD-10-CM | POA: Diagnosis not present

## 2024-08-30 DIAGNOSIS — G9340 Encephalopathy, unspecified: Secondary | ICD-10-CM | POA: Diagnosis not present

## 2024-08-30 DIAGNOSIS — M6281 Muscle weakness (generalized): Secondary | ICD-10-CM | POA: Diagnosis not present

## 2024-08-30 DIAGNOSIS — R1312 Dysphagia, oropharyngeal phase: Secondary | ICD-10-CM | POA: Diagnosis not present

## 2024-08-30 DIAGNOSIS — R278 Other lack of coordination: Secondary | ICD-10-CM | POA: Diagnosis not present

## 2024-08-31 DIAGNOSIS — R1312 Dysphagia, oropharyngeal phase: Secondary | ICD-10-CM | POA: Diagnosis not present

## 2024-08-31 DIAGNOSIS — R278 Other lack of coordination: Secondary | ICD-10-CM | POA: Diagnosis not present

## 2024-08-31 DIAGNOSIS — M6281 Muscle weakness (generalized): Secondary | ICD-10-CM | POA: Diagnosis not present

## 2024-08-31 DIAGNOSIS — G9340 Encephalopathy, unspecified: Secondary | ICD-10-CM | POA: Diagnosis not present

## 2024-09-03 ENCOUNTER — Other Ambulatory Visit: Payer: Self-pay

## 2024-09-03 ENCOUNTER — Emergency Department (HOSPITAL_COMMUNITY)
Admission: EM | Admit: 2024-09-03 | Discharge: 2024-09-03 | Disposition: A | Discharge status: Other Acute Inpatient Hospital | Attending: Emergency Medicine | Admitting: Emergency Medicine

## 2024-09-03 ENCOUNTER — Encounter (HOSPITAL_COMMUNITY): Payer: Self-pay | Admitting: Emergency Medicine

## 2024-09-03 DIAGNOSIS — M6281 Muscle weakness (generalized): Secondary | ICD-10-CM | POA: Diagnosis not present

## 2024-09-03 DIAGNOSIS — D509 Iron deficiency anemia, unspecified: Secondary | ICD-10-CM | POA: Diagnosis not present

## 2024-09-03 DIAGNOSIS — Z7901 Long term (current) use of anticoagulants: Secondary | ICD-10-CM | POA: Diagnosis not present

## 2024-09-03 DIAGNOSIS — L89322 Pressure ulcer of left buttock, stage 2: Secondary | ICD-10-CM | POA: Diagnosis not present

## 2024-09-03 DIAGNOSIS — R1312 Dysphagia, oropharyngeal phase: Secondary | ICD-10-CM | POA: Diagnosis not present

## 2024-09-03 DIAGNOSIS — G9340 Encephalopathy, unspecified: Secondary | ICD-10-CM | POA: Diagnosis not present

## 2024-09-03 DIAGNOSIS — Z7982 Long term (current) use of aspirin: Secondary | ICD-10-CM | POA: Insufficient documentation

## 2024-09-03 DIAGNOSIS — R278 Other lack of coordination: Secondary | ICD-10-CM | POA: Diagnosis not present

## 2024-09-03 DIAGNOSIS — R404 Transient alteration of awareness: Secondary | ICD-10-CM | POA: Diagnosis not present

## 2024-09-03 DIAGNOSIS — Z888 Allergy status to other drugs, medicaments and biological substances status: Secondary | ICD-10-CM | POA: Diagnosis not present

## 2024-09-03 DIAGNOSIS — Z79899 Other long term (current) drug therapy: Secondary | ICD-10-CM | POA: Diagnosis not present

## 2024-09-03 DIAGNOSIS — Z7401 Bed confinement status: Secondary | ICD-10-CM | POA: Diagnosis not present

## 2024-09-03 DIAGNOSIS — F0390 Unspecified dementia without behavioral disturbance: Secondary | ICD-10-CM | POA: Insufficient documentation

## 2024-09-03 DIAGNOSIS — I4821 Permanent atrial fibrillation: Secondary | ICD-10-CM | POA: Diagnosis not present

## 2024-09-03 DIAGNOSIS — Z87440 Personal history of urinary (tract) infections: Secondary | ICD-10-CM | POA: Diagnosis not present

## 2024-09-03 DIAGNOSIS — E538 Deficiency of other specified B group vitamins: Secondary | ICD-10-CM | POA: Diagnosis not present

## 2024-09-03 DIAGNOSIS — D6832 Hemorrhagic disorder due to extrinsic circulating anticoagulants: Secondary | ICD-10-CM | POA: Diagnosis not present

## 2024-09-03 DIAGNOSIS — Z9181 History of falling: Secondary | ICD-10-CM | POA: Diagnosis not present

## 2024-09-03 DIAGNOSIS — K644 Residual hemorrhoidal skin tags: Secondary | ICD-10-CM | POA: Insufficient documentation

## 2024-09-03 DIAGNOSIS — G309 Alzheimer's disease, unspecified: Secondary | ICD-10-CM | POA: Diagnosis not present

## 2024-09-03 DIAGNOSIS — R58 Hemorrhage, not elsewhere classified: Secondary | ICD-10-CM | POA: Diagnosis not present

## 2024-09-03 DIAGNOSIS — Z66 Do not resuscitate: Secondary | ICD-10-CM | POA: Diagnosis not present

## 2024-09-03 DIAGNOSIS — I4892 Unspecified atrial flutter: Secondary | ICD-10-CM | POA: Diagnosis not present

## 2024-09-03 DIAGNOSIS — T45515A Adverse effect of anticoagulants, initial encounter: Secondary | ICD-10-CM | POA: Diagnosis not present

## 2024-09-03 DIAGNOSIS — I10 Essential (primary) hypertension: Secondary | ICD-10-CM | POA: Diagnosis not present

## 2024-09-03 DIAGNOSIS — L89312 Pressure ulcer of right buttock, stage 2: Secondary | ICD-10-CM | POA: Diagnosis not present

## 2024-09-03 DIAGNOSIS — Z743 Need for continuous supervision: Secondary | ICD-10-CM | POA: Diagnosis not present

## 2024-09-03 DIAGNOSIS — K625 Hemorrhage of anus and rectum: Secondary | ICD-10-CM | POA: Diagnosis not present

## 2024-09-03 LAB — CBC WITH DIFFERENTIAL/PLATELET
Abs Immature Granulocytes: 0.04 K/uL (ref 0.00–0.07)
Basophils Absolute: 0 K/uL (ref 0.0–0.1)
Basophils Relative: 0 %
Eosinophils Absolute: 0.5 K/uL (ref 0.0–0.5)
Eosinophils Relative: 5 %
HCT: 31.4 % — ABNORMAL LOW (ref 39.0–52.0)
Hemoglobin: 9.8 g/dL — ABNORMAL LOW (ref 13.0–17.0)
Immature Granulocytes: 0 %
Lymphocytes Relative: 11 %
Lymphs Abs: 1.1 K/uL (ref 0.7–4.0)
MCH: 29.8 pg (ref 26.0–34.0)
MCHC: 31.2 g/dL (ref 30.0–36.0)
MCV: 95.4 fL (ref 80.0–100.0)
Monocytes Absolute: 1.1 K/uL — ABNORMAL HIGH (ref 0.1–1.0)
Monocytes Relative: 11 %
Neutro Abs: 7 K/uL (ref 1.7–7.7)
Neutrophils Relative %: 73 %
Platelets: 271 K/uL (ref 150–400)
RBC: 3.29 MIL/uL — ABNORMAL LOW (ref 4.22–5.81)
RDW: 14 % (ref 11.5–15.5)
WBC: 9.7 K/uL (ref 4.0–10.5)
nRBC: 0 % (ref 0.0–0.2)

## 2024-09-03 LAB — COMPREHENSIVE METABOLIC PANEL WITH GFR
ALT: 12 U/L (ref 0–44)
AST: 15 U/L (ref 15–41)
Albumin: 2.9 g/dL — ABNORMAL LOW (ref 3.5–5.0)
Alkaline Phosphatase: 91 U/L (ref 38–126)
Anion gap: 9 (ref 5–15)
BUN: 22 mg/dL (ref 8–23)
CO2: 25 mmol/L (ref 22–32)
Calcium: 8.4 mg/dL — ABNORMAL LOW (ref 8.9–10.3)
Chloride: 108 mmol/L (ref 98–111)
Creatinine, Ser: 1.11 mg/dL (ref 0.61–1.24)
GFR, Estimated: >60 mL/min (ref >60)
Glucose, Bld: 115 mg/dL — ABNORMAL HIGH (ref 70–99)
Potassium: 4.1 mmol/L (ref 3.5–5.1)
Sodium: 141 mmol/L (ref 135–145)
Total Bilirubin: 0.3 mg/dL (ref 0.0–1.2)
Total Protein: 6.3 g/dL — ABNORMAL LOW (ref 6.5–8.1)

## 2024-09-03 LAB — TYPE AND SCREEN
ABO/RH(D): O POS
Antibody Screen: NEGATIVE

## 2024-09-03 LAB — ABO/RH: ABO/RH(D): O POS

## 2024-09-03 LAB — MAGNESIUM: Magnesium: 2.3 mg/dL (ref 1.7–2.4)

## 2024-09-03 LAB — PROTIME-INR
INR: 2.8 — ABNORMAL HIGH (ref 0.8–1.2)
Prothrombin Time: 31 s — ABNORMAL HIGH (ref 11.4–15.2)

## 2024-09-03 MED ORDER — HYDROCORTISONE 1 % EX CREA: TOPICAL_CREAM | CUTANEOUS | 0 refills | Status: DC

## 2024-09-03 MED ORDER — PSYLLIUM 58.6 % PO PACK: 1.0000 | PACK | Freq: Every day | ORAL | 12 refills | Status: DC

## 2024-09-03 NOTE — ED Provider Notes (Signed)
 [place] EMERGENCY DEPARTMENT AT [facility] Provider Note   CSN: [id number] Arrival date & time: 09/03/24  1956     Patient presents with: Rectal Bleeding   Alexander Bradley is a 83 y.o. male.   HPI Patient presents for rectal bleeding.  Medical history includes dementia, anemia, HTN, atrial flutter.  He was seen in the ED a week ago for same concern.  Nursing staff at his facility at the time had noticed some blood clots from his rectum.  His vital signs at the time were normal and his hemoglobin was baseline.  He was diagnosed with and treated for UTI.  He was prescribed Keflex .  Today, staff at nursing facility noted recurrence of rectal bleeding over the past hour.  Patient, himself, has no complaints.  He denies any abdominal pain.     Prior to Admission medications   Medication Sig Start Date End Date Taking? Authorizing Provider  hydrocortisone  cream 1 % Apply to affected area 2 times daily 09/03/24  Yes Melvenia Motto, MD  psyllium (METAMUCIL) 58.6 % packet Take 1 packet by mouth daily. 09/03/24  Yes Melvenia Motto, MD  amLODipine  (NORVASC ) 5 MG tablet Take 2.5 mg by mouth daily.    [provider]  amoxicillin -clavulanate (AUGMENTIN ) 875-125 MG tablet Take 1 tablet by mouth every 12 (twelve) hours. 12/12/23   Small, Brooke L, PA  aspirin 81 MG chewable tablet Chew 81 mg by mouth daily.    [provider]  atropine 1 % ophthalmic solution Place 1 drop under the tongue 3 (three) times daily. For excessive drooling    [provider]  azithromycin  (ZITHROMAX ) 250 MG tablet Take 1 tablet (250 mg total) by mouth daily. 12/12/23   Small, Brooke L, PA  cephALEXin  (KEFLEX ) 500 MG capsule Take 1 capsule (500 mg total) by mouth 4 (four) times daily for 7 days. 08/27/24 09/03/24  Kammerer, Megan L, DO  Cholecalciferol (VITAMIN D3) 1000 units CAPS Take 1,000 Units by mouth daily.    [provider]  ferrous sulfate  325 (65 FE) MG EC tablet Take 1 tablet  (325 mg total) by mouth daily with breakfast. 07/11/23 07/10/24  Stephanie Freund, MD  losartan (COZAAR) 50 MG tablet Take 50 mg by mouth every evening.    [provider]  rivaroxaban (XARELTO) 20 MG TABS tablet Take 20 mg by mouth every evening.    [provider]  senna (SENOKOT) 8.6 MG TABS tablet Take 17.2 mg by mouth 2 (two) times daily.    [provider]  sertraline  (ZOLOFT ) 25 MG tablet Take 75 mg by mouth at bedtime.    [provider]    Allergies: Transderm-scop [scopolamine]    Review of Systems  Unable to perform ROS: Dementia  Gastrointestinal:  Positive for anal bleeding.    Updated Vital Signs BP (!) 122/94   Pulse 80   Temp 98.5 F (36.9 C)   Resp 20   SpO2 99%   Physical Exam Vitals and nursing note reviewed. Exam conducted with a chaperone present.  Constitutional:      General: He is not in acute distress.    Appearance: Normal appearance. He is well-developed. He is not ill-appearing, toxic-appearing or diaphoretic.  HENT:     Head: Normocephalic and atraumatic.     Right Ear: External ear normal.     Left Ear: External ear normal.     Nose: Nose normal.     Mouth/Throat:     Mouth: Mucous membranes  are moist.  Eyes:     Extraocular Movements: Extraocular movements intact.     Conjunctiva/sclera: Conjunctivae normal.  Cardiovascular:     Rate and Rhythm: Normal rate and regular rhythm.  Pulmonary:     Effort: Pulmonary effort is normal. No respiratory distress.  Abdominal:     General: There is no distension.     Palpations: Abdomen is soft.     Tenderness: There is no abdominal tenderness.  Genitourinary:    Rectum: External hemorrhoid present.  Musculoskeletal:        General: No swelling.     Cervical back: Normal range of motion and neck supple.  Skin:    General: Skin is warm and dry.     Capillary Refill: Capillary refill takes less than 2 seconds.     Coloration: Skin is not jaundiced or pale.   Neurological:     General: No focal deficit present.     Mental Status: He is alert and oriented to person, place, and time.  Psychiatric:        Mood and Affect: Mood normal.        Behavior: Behavior normal.     (all labs ordered are listed, but only abnormal results are displayed) Labs Reviewed  COMPREHENSIVE METABOLIC PANEL WITH GFR - Abnormal; Notable for the following components:      Result Value   Glucose, Bld 115 (*)    Calcium 8.4 (*)    Total Protein 6.3 (*)    Albumin 2.9 (*)    All other components within normal limits  CBC WITH DIFFERENTIAL/PLATELET - Abnormal; Notable for the following components:   RBC 3.29 (*)    Hemoglobin 9.8 (*)    HCT 31.4 (*)    Monocytes Absolute 1.1 (*)    All other components within normal limits  PROTIME-INR - Abnormal; Notable for the following components:   Prothrombin Time 31.0 (*)    INR 2.8 (*)    All other components within normal limits  MAGNESIUM  POC OCCULT BLOOD, ED  TYPE AND SCREEN  ABO/RH    EKG: None  Radiology: No results found.   Procedures   Medications Ordered in the ED - No data to display                                  Medical Decision Making Amount and/or Complexity of Data Reviewed Labs: ordered.   This patient presents to the ED for concern of rectal bleeding, this involves an extensive number of treatment options, and is a complaint that carries with it a high risk of complications and morbidity.  The differential diagnosis includes hemorrhoidal bleed, diverticular bleed, anal fissure, neoplasm   Co morbidities / Chronic conditions that complicate the patient evaluation  dementia, anemia, HTN, atrial flutter   Additional history obtained:  Additional history obtained from EMR External records from outside source obtained and reviewed including N/A   Lab Tests:  I Ordered, and personally interpreted labs.  The pertinent results include: Baseline hemoglobin, no leukocytosis, normal  kidney function, normal electrolytes  Problem List / ED Course / Critical interventions / Medication management  Patient presenting for recurrence of rectal bleeding.  Seen in the ED a week ago for the same.  He is hemoglobin is stable at that time.  On arrival in the ED today, patient's vital signs are normal.  He is overall well-appearing on exam.  He is  unable to provide any history due to his dementia.  His abdomen is soft and nontender.  He denies any areas of discomfort.  Rectal exam was performed with nurse chaperone present.  Patient has a large external hemorrhoid.  It does appear to be thrombosed.  Timeline of thrombosed hemorrhoids is unclear.  There is a scant amount of red blood present.  On DRE, there is no presence of melena or evidence of further bleeding.  Lab work is reassuring.  Patient was provided with instructions on conservative management of hemorrhoids.  He was discharged in stable condition.  Social Determinants of Health:  Has dementia.  Resides in nursing facility.     Final diagnoses:  External hemorrhoid, bleeding    ED Discharge Orders          Ordered    hydrocortisone  cream 1 %        09/03/24 2204    psyllium (METAMUCIL) 58.6 % packet  Daily        09/03/24 2204               Melvenia Motto, MD 09/03/24 2206

## 2024-09-03 NOTE — ED Triage Notes (Signed)
 BIB [facility] Pt from [facility] w/ c/o rectal bleeding x 1 hour. D/c recently for same.  Taking abx for UTI. A&O x O at baseline  118/74 80 HR  98%  152 CBG

## 2024-09-03 NOTE — Discharge Instructions (Addendum)
 You have a bleeding external hemorrhoid.  Treatment for this is sitz bath's, dietary fiber, Preparation H.  Follow-up with your primary care doctor.  Return to the emergency department for any new or worsening symptoms of concern.

## 2024-09-04 ENCOUNTER — Inpatient Hospital Stay (HOSPITAL_COMMUNITY)
Admission: EM | Admit: 2024-09-04 | Discharge: 2024-09-07 | DRG: 394 | Disposition: A | Discharge status: Skilled Nursing Facility | Source: Skilled Nursing Facility | Attending: Family Medicine | Admitting: Family Medicine

## 2024-09-04 ENCOUNTER — Encounter (HOSPITAL_COMMUNITY): Payer: Self-pay

## 2024-09-04 DIAGNOSIS — L89322 Pressure ulcer of left buttock, stage 2: Secondary | ICD-10-CM | POA: Diagnosis present

## 2024-09-04 DIAGNOSIS — K625 Hemorrhage of anus and rectum: Principal | ICD-10-CM

## 2024-09-04 DIAGNOSIS — Z888 Allergy status to other drugs, medicaments and biological substances status: Secondary | ICD-10-CM

## 2024-09-04 DIAGNOSIS — R58 Hemorrhage, not elsewhere classified: Secondary | ICD-10-CM | POA: Diagnosis not present

## 2024-09-04 DIAGNOSIS — Z87440 Personal history of urinary (tract) infections: Secondary | ICD-10-CM | POA: Diagnosis not present

## 2024-09-04 DIAGNOSIS — G309 Alzheimer's disease, unspecified: Secondary | ICD-10-CM | POA: Diagnosis present

## 2024-09-04 DIAGNOSIS — T45515A Adverse effect of anticoagulants, initial encounter: Secondary | ICD-10-CM | POA: Diagnosis present

## 2024-09-04 DIAGNOSIS — I10 Essential (primary) hypertension: Secondary | ICD-10-CM | POA: Diagnosis present

## 2024-09-04 DIAGNOSIS — Z9181 History of falling: Secondary | ICD-10-CM

## 2024-09-04 DIAGNOSIS — D6832 Hemorrhagic disorder due to extrinsic circulating anticoagulants: Secondary | ICD-10-CM | POA: Diagnosis present

## 2024-09-04 DIAGNOSIS — I4892 Unspecified atrial flutter: Secondary | ICD-10-CM | POA: Diagnosis not present

## 2024-09-04 DIAGNOSIS — E538 Deficiency of other specified B group vitamins: Secondary | ICD-10-CM | POA: Diagnosis present

## 2024-09-04 DIAGNOSIS — R059 Cough, unspecified: Secondary | ICD-10-CM | POA: Diagnosis not present

## 2024-09-04 DIAGNOSIS — K922 Gastrointestinal hemorrhage, unspecified: Secondary | ICD-10-CM | POA: Diagnosis present

## 2024-09-04 DIAGNOSIS — E669 Obesity, unspecified: Secondary | ICD-10-CM | POA: Diagnosis present

## 2024-09-04 DIAGNOSIS — F0280 Dementia in other diseases classified elsewhere without behavioral disturbance: Secondary | ICD-10-CM | POA: Diagnosis present

## 2024-09-04 DIAGNOSIS — L89312 Pressure ulcer of right buttock, stage 2: Secondary | ICD-10-CM | POA: Diagnosis present

## 2024-09-04 DIAGNOSIS — D509 Iron deficiency anemia, unspecified: Secondary | ICD-10-CM | POA: Diagnosis present

## 2024-09-04 DIAGNOSIS — Z66 Do not resuscitate: Secondary | ICD-10-CM | POA: Diagnosis present

## 2024-09-04 DIAGNOSIS — I4821 Permanent atrial fibrillation: Secondary | ICD-10-CM | POA: Diagnosis present

## 2024-09-04 DIAGNOSIS — K649 Unspecified hemorrhoids: Secondary | ICD-10-CM | POA: Diagnosis not present

## 2024-09-04 DIAGNOSIS — Z79899 Other long term (current) drug therapy: Secondary | ICD-10-CM

## 2024-09-04 DIAGNOSIS — K644 Residual hemorrhoidal skin tags: Principal | ICD-10-CM | POA: Diagnosis present

## 2024-09-04 DIAGNOSIS — F0150 Vascular dementia without behavioral disturbance: Secondary | ICD-10-CM | POA: Diagnosis not present

## 2024-09-04 DIAGNOSIS — Z7401 Bed confinement status: Secondary | ICD-10-CM | POA: Diagnosis not present

## 2024-09-04 DIAGNOSIS — Z7901 Long term (current) use of anticoagulants: Secondary | ICD-10-CM

## 2024-09-04 DIAGNOSIS — Z7982 Long term (current) use of aspirin: Secondary | ICD-10-CM | POA: Diagnosis not present

## 2024-09-04 DIAGNOSIS — R404 Transient alteration of awareness: Secondary | ICD-10-CM | POA: Diagnosis not present

## 2024-09-04 DIAGNOSIS — Z743 Need for continuous supervision: Secondary | ICD-10-CM | POA: Diagnosis not present

## 2024-09-04 LAB — CBC WITH DIFFERENTIAL/PLATELET
Abs Immature Granulocytes: 0.04 K/uL (ref 0.00–0.07)
Basophils Absolute: 0 K/uL (ref 0.0–0.1)
Basophils Relative: 0 %
Eosinophils Absolute: 0.3 K/uL (ref 0.0–0.5)
Eosinophils Relative: 3 %
HCT: 34.3 % — ABNORMAL LOW (ref 39.0–52.0)
Hemoglobin: 10.7 g/dL — ABNORMAL LOW (ref 13.0–17.0)
Immature Granulocytes: 0 %
Lymphocytes Relative: 13 %
Lymphs Abs: 1.2 K/uL (ref 0.7–4.0)
MCH: 30.1 pg (ref 26.0–34.0)
MCHC: 31.2 g/dL (ref 30.0–36.0)
MCV: 96.6 fL (ref 80.0–100.0)
Monocytes Absolute: 1.1 K/uL — ABNORMAL HIGH (ref 0.1–1.0)
Monocytes Relative: 12 %
Neutro Abs: 6.8 K/uL (ref 1.7–7.7)
Neutrophils Relative %: 72 %
Platelets: 282 K/uL (ref 150–400)
RBC: 3.55 MIL/uL — ABNORMAL LOW (ref 4.22–5.81)
RDW: 13.7 % (ref 11.5–15.5)
WBC: 9.4 K/uL (ref 4.0–10.5)
nRBC: 0 % (ref 0.0–0.2)

## 2024-09-04 LAB — TYPE AND SCREEN
ABO/RH(D): O POS
Antibody Screen: NEGATIVE

## 2024-09-04 LAB — COMPREHENSIVE METABOLIC PANEL WITH GFR
ALT: 15 U/L (ref 0–44)
AST: 14 U/L — ABNORMAL LOW (ref 15–41)
Albumin: 2.7 g/dL — ABNORMAL LOW (ref 3.5–5.0)
Alkaline Phosphatase: 74 U/L (ref 38–126)
Anion gap: 9 (ref 5–15)
BUN: 22 mg/dL (ref 8–23)
CO2: 24 mmol/L (ref 22–32)
Calcium: 8.2 mg/dL — ABNORMAL LOW (ref 8.9–10.3)
Chloride: 107 mmol/L (ref 98–111)
Creatinine, Ser: 1 mg/dL (ref 0.61–1.24)
GFR, Estimated: >60 mL/min (ref >60)
Glucose, Bld: 103 mg/dL — ABNORMAL HIGH (ref 70–99)
Potassium: 3.9 mmol/L (ref 3.5–5.1)
Sodium: 140 mmol/L (ref 135–145)
Total Bilirubin: 0.6 mg/dL (ref 0.0–1.2)
Total Protein: 6.9 g/dL (ref 6.5–8.1)

## 2024-09-04 LAB — POC OCCULT BLOOD, ED: Fecal Occult Bld: POSITIVE — AB

## 2024-09-04 MED ORDER — SERTRALINE HCL 50 MG PO TABS
50.0000 mg | ORAL_TABLET | Freq: Every day | ORAL | Status: DC
  Administered 2024-09-05 – 2024-09-06 (×3): 50 mg via ORAL
  Filled 2024-09-04 (×3): qty 1

## 2024-09-04 MED ORDER — LACTATED RINGERS IV BOLUS
1000.0000 mL | Freq: Once | INTRAVENOUS | Status: AC
Start: 2024-09-04 — End: 2024-09-05
  Administered 2024-09-04: 1000 mL via INTRAVENOUS

## 2024-09-04 MED ORDER — PANTOPRAZOLE SODIUM 40 MG PO TBEC
40.0000 mg | DELAYED_RELEASE_TABLET | Freq: Every day | ORAL | Status: DC
  Administered 2024-09-05 – 2024-09-07 (×3): 40 mg via ORAL
  Filled 2024-09-04 (×3): qty 1

## 2024-09-04 NOTE — ED Triage Notes (Signed)
 Pt bib [facility], seen yesterday at [facility] for rectal bleeding and was d/c last night. Staff at nursing home says he is still bleeding and family wanted him seen again. Pt says bleeding has been going on for three days. C/o no pain. Rhonchi auscultated and pt has a cough. Alert to self at baseline.  130/70 90 hr 162 cbg 98%ra

## 2024-09-04 NOTE — ED Provider Notes (Signed)
 [place] [facility] Provider Note  CSN: [id number] Arrival date & time: 09/04/24 1559  Chief Complaint(s) Rectal Bleeding  HPI Alexander Bradley is a 83 y.o. male history of dementia, atrial flutter on Xarelto presenting to the emergency department with rectal bleeding.  Patient was seen in the emergency department twice previously for this.  Last was yesterday.  Was thought to have bleeding hemorrhoid and discharged.  Has continued with Xarelto and continued to have some rectal bleeding.  Patient reports overall he feels well although he also reports he does not know why he is in the emergency department.  History is limited due to dementia.   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 Authorizing Provider  amLODipine  (NORVASC ) 5 MG tablet Take 2.5 mg by mouth daily.    [provider]  amoxicillin -clavulanate (AUGMENTIN ) 875-125 MG tablet Take 1 tablet by mouth every 12 (twelve) hours. 12/12/23   Small, Brooke L, PA  aspirin 81 MG chewable tablet Chew 81 mg by mouth daily.    [provider]  atropine 1 % ophthalmic solution Place 1 drop under the tongue 3 (three) times daily. For excessive drooling    [provider]  azithromycin  (ZITHROMAX ) 250 MG tablet Take 1 tablet (250 mg total) by  mouth daily. 12/12/23   Small, Brooke L, PA  Cholecalciferol (VITAMIN D3) 1000 units CAPS Take 1,000 Units by mouth daily.    [provider]  ferrous sulfate  325 (65 FE) MG EC tablet Take 1 tablet (325 mg total) by mouth daily with breakfast. 07/11/23 07/10/24  Stephanie Freund, MD  hydrocortisone  cream 1 % Apply to affected area 2 times daily 09/03/24   Melvenia Motto, MD  losartan (COZAAR) 50 MG tablet Take 50 mg by mouth every evening.    [provider]  psyllium (METAMUCIL) 58.6 % packet Take 1 packet by mouth daily. 09/03/24   Melvenia Motto, MD  rivaroxaban (XARELTO) 20 MG TABS tablet Take 20 mg by mouth every evening.    [provider]  senna (SENOKOT) 8.6 MG TABS tablet Take 17.2 mg by mouth 2 (two) times daily.    [provider]  sertraline  (ZOLOFT ) 25 MG tablet Take 75 mg by mouth at bedtime.    [provider]  Past Surgical History Past Surgical History:  Procedure Laterality Date   PET ALZHEIMER/DEMENTIA STUDY ([facility] HX)     Family History History reviewed. No pertinent family history.  Social History Social History   Tobacco Use   Smoking status: Never   Smokeless tobacco: Never  Substance Use Topics   Alcohol use: No   Drug use: No   Allergies Transderm-scop [scopolamine]  Review of Systems Review of Systems  All other systems reviewed and are negative.   Physical Exam Vital Signs  I have reviewed the triage vital signs BP 133/75   Pulse 85   Temp 98.9 F (37.2 C) (Oral)   Resp 17   SpO2 100%  Physical Exam Vitals and nursing note reviewed.  Constitutional:      General: He is not in acute distress.    Appearance: Normal appearance.  HENT:     Mouth/Throat:     Mouth: Mucous membranes are moist.  Eyes:     Conjunctiva/sclera: Conjunctivae normal.  Cardiovascular:     Rate and Rhythm:  Normal rate and regular rhythm.  Pulmonary:     Effort: Pulmonary effort is normal. No respiratory distress.     Breath sounds: Normal breath sounds.  Abdominal:     General: Abdomen is flat.     Palpations: Abdomen is soft.     Tenderness: There is no abdominal tenderness.  Genitourinary:    Comments: Chaperoned by nurse technician, small hemorrhoid present about thrombosis or signs of obvious bleeding.  Some bright red blood in brief.  Rectal exam with brown stool, no melena Musculoskeletal:     Right lower leg: No edema.     Left lower leg: No edema.  Skin:    General: Skin is warm and dry.     Capillary Refill: Capillary refill takes less than 2 seconds.  Neurological:     Mental Status: He is alert. Mental status is at baseline.     Comments: Oriented to self, knows he is in the hospital and knows it is 2025, but does not know why he is here  Psychiatric:        Mood and Affect: Mood normal.        Behavior: Behavior normal.     ED Results and Treatments Labs (all labs ordered are listed, but only abnormal results are displayed) Labs Reviewed  CBC WITH DIFFERENTIAL/PLATELET - Abnormal; Notable for the following components:      Result Value   RBC 3.55 (*)    Hemoglobin 10.7 (*)    HCT 34.3 (*)    Monocytes Absolute 1.1 (*)    All other components within normal limits  COMPREHENSIVE METABOLIC PANEL WITH GFR - Abnormal; Notable for the following components:   Glucose, Bld 103 (*)    Calcium 8.2 (*)    Albumin 2.7 (*)    AST 14 (*)    All other components within normal limits  POC OCCULT BLOOD, ED - Abnormal; Notable for the following components:   Fecal Occult Bld POSITIVE (*)    All other components within normal limits  TYPE AND SCREEN  Radiology No results found.  Pertinent labs & imaging results that were available during my care of the  patient were reviewed by me and considered in my medical decision making (see MDM for details).  Medications Ordered in ED Medications  lactated ringers  bolus 1,000 mL (has no administration in time range)                                                                                                                                     Procedures Procedures  (including critical care time)  Medical Decision Making / ED Course   MDM:  83 year old presenting to the emergency department with rectal bleeding.  Patient has had previous ER evaluations for this however his rectal bleeding has persisted.  At this point, feel patient should be admitted for observation and GI evaluation.  I discussed with Dr. Avram with [facility] who agrees with this and will consult on the patient, request that Eliquis is discontinued.  GI will see patient tomorrow morning.  Examination is otherwise nonfocal.  Does have hemorrhoid which could be bleeding source however on my evaluation no clear obvious signs of active bleeding.  His hemoglobin is stable.  Discussed with Dr. Arthea, will admit patient.  Clinical Course as of 09/04/24 1908  Tue Sep 04, 2024  8165 Discussed with Dr. Avram who will consult on pt.  [WS]    Clinical Course User Index [WS] Francesca Elsie CROME, MD     Additional history obtained: -Additional history obtained from ems -External records from outside source obtained and reviewed including: Chart review including previous notes, labs, imaging, consultation notes including prior er evaluation   Lab Tests: -I ordered, reviewed, and interpreted labs.   The pertinent results include:   Labs Reviewed  CBC WITH DIFFERENTIAL/PLATELET - Abnormal; Notable for the following components:      Result Value   RBC 3.55 (*)    Hemoglobin 10.7 (*)    HCT 34.3 (*)    Monocytes Absolute 1.1 (*)    All other components within normal limits  COMPREHENSIVE METABOLIC PANEL WITH GFR -  Abnormal; Notable for the following components:   Glucose, Bld 103 (*)    Calcium 8.2 (*)    Albumin 2.7 (*)    AST 14 (*)    All other components within normal limits  POC OCCULT BLOOD, ED - Abnormal; Notable for the following components:   Fecal Occult Bld POSITIVE (*)    All other components within normal limits  TYPE AND SCREEN    Notable for mild anemia      Medicines ordered and prescription drug management: Meds ordered this encounter  Medications   lactated ringers  bolus 1,000 mL    -I have reviewed the patients home medicines and have made adjustments as needed   Consultations Obtained: I requested consultation with the gastroenterologist,  and discussed lab and imaging findings as well as pertinent plan -  they recommend: admit  Social Determinants of Health:  Diagnosis or treatment significantly limited by social determinants of health: obesity   Reevaluation: After the interventions noted above, I reevaluated the patient and found that their symptoms have improved  Co morbidities that complicate the patient evaluation  Past Medical History:  Diagnosis Date   Encephalopathy    Hypertension    Kidney disease    Respiratory abnormality       Dispostion: Disposition decision including need for hospitalization was considered, and patient admitted to the hospital.    Final Clinical Impression(s) / ED Diagnoses Final diagnoses:  Rectal bleeding     This chart was dictated using voice recognition software.  Despite best efforts to proofread,  errors can occur which can change the documentation meaning.    Francesca Elsie CROME, MD 09/04/24 TYRA

## 2024-09-04 NOTE — H&P (Signed)
 History and Physical    Patient: Alexander Bradley FMW:[id number] DOB: [birth year] DOA: 09/04/2024 DOS: the patient was seen and examined on 09/04/2024 PCP: Caleen Dirks, MD  Patient coming from: ALF/ILF  Chief Complaint:  Chief Complaint  Patient presents with   Rectal Bleeding   HPI: Alexander Bradley is a 83 y.o. male resident of [facility] with medical history significant for dementia and atrial fibrillation on Xarelto who was sent in today because of a large amount of rectal bleeding.  This is patient's third ER visit in the last 9 days for rectal bleeding. The patient has continued to take Xarelto every day as the medication was not discontinued. His hemoglobin has remained stable at 10.7.  Patient has dementia and is not able to participate with a history. The ER doctor felt that he should be admitted after 3 ER visits with no change.  [facility] was consulted from the ER.   Review of Systems: unable to review all systems due to the inability of the patient to answer questions. Dementia Past Medical History:  Diagnosis Date   Encephalopathy    Hypertension    Kidney disease    Respiratory abnormality    Past Surgical History:  Procedure Laterality Date   PET ALZHEIMER/DEMENTIA STUDY ([facility] HX)     Social History:  reports that he has never smoked. He has never used smokeless tobacco. He reports that he does not drink alcohol and does not use drugs.  Allergies  Allergen Reactions   Transderm-Scop [Scopolamine] Other (See Comments)    Unknown reaction Documented on MAR    History reviewed. No pertinent family history.  Prior to Admission medications   Medication Sig Start Date End Date Taking? Authorizing Provider  amLODipine  (NORVASC ) 5 MG tablet Take 2.5 mg by mouth daily.    [provider]  amoxicillin -clavulanate (AUGMENTIN ) 875-125 MG tablet Take 1 tablet by mouth every 12 (twelve) hours. 12/12/23   Small, Brooke L, PA  aspirin 81 MG chewable  tablet Chew 81 mg by mouth daily.    [provider]  atropine 1 % ophthalmic solution Place 1 drop under the tongue 3 (three) times daily. For excessive drooling    [provider]  azithromycin  (ZITHROMAX ) 250 MG tablet Take 1 tablet (250 mg total) by mouth daily. 12/12/23   Small, Brooke L, PA  Cholecalciferol (VITAMIN D3) 1000 units CAPS Take 1,000 Units by mouth daily.    [provider]  ferrous sulfate  325 (65 FE) MG EC tablet Take 1 tablet (325 mg total) by mouth daily with breakfast. 07/11/23 07/10/24  Stephanie Freund, MD  hydrocortisone  cream 1 % Apply to affected area 2 times daily 09/03/24   Melvenia Motto, MD  losartan (COZAAR) 50 MG tablet Take 50 mg by mouth every evening.    [provider]  psyllium (METAMUCIL) 58.6 % packet Take 1 packet by mouth daily. 09/03/24   Melvenia Motto, MD  rivaroxaban (XARELTO) 20 MG TABS tablet Take 20 mg by mouth every evening.    [provider]  senna (SENOKOT) 8.6 MG TABS tablet Take 17.2 mg by mouth 2 (two) times daily.    [provider]  sertraline  (ZOLOFT ) 25 MG tablet Take 75 mg by mouth at bedtime.    [provider]    Physical Exam: Vitals:   09/04/24 1630 09/04/24 1632 09/04/24 1815 09/04/24 1845  BP: (!) 105/52  (!) 147/62 133/75  Pulse: 75  83 85  Resp: 19  18 17  Temp:  98.9 F (37.2 C)    TempSrc:  Oral    SpO2: 99%  100% 100%   Physical Exam:  General: No acute distress, inattentive  HEENT: Normocephalic, atraumatic, PERRL Cardiovascular: Normal rate and rhythm. Distal pulses intact. Pulmonary: Normal pulmonary effort, normal breath sounds Gastrointestinal: Nondistended abdomen, soft, non-tender, normoactive bowel sounds Musculoskeletal:No lower ext edema Skin: Skin is warm and dry. Neuro: Alert and oriented to name only.  He leans to the left and has trouble holding himself up.  Chronically weak.  He says he is thirsty.  When he drinks put in front of him he does not  reach for it.  He does not smoke spoken to and only responds with 1 or 2 words PSYCH: Inattentive and cooperative  Data Reviewed:  Results for orders placed or performed during the hospital encounter of 09/04/24 (from the past 24 hours)  CBC with Differential     Status: Abnormal   Collection Time: 09/04/24  4:23 PM  Result Value Ref Range   WBC 9.4 4.0 - 10.5 K/uL   RBC 3.55 (L) 4.22 - 5.81 MIL/uL   Hemoglobin 10.7 (L) 13.0 - 17.0 g/dL   HCT 65.6 (L) 60.9 - 47.9 %   MCV 96.6 80.0 - 100.0 fL   MCH 30.1 26.0 - 34.0 pg   MCHC 31.2 30.0 - 36.0 g/dL   RDW 86.2 88.4 - 84.4 %   Platelets 282 150 - 400 K/uL   nRBC 0.0 0.0 - 0.2 %   Neutrophils Relative % 72 %   Neutro Abs 6.8 1.7 - 7.7 K/uL   Lymphocytes Relative 13 %   Lymphs Abs 1.2 0.7 - 4.0 K/uL   Monocytes Relative 12 %   Monocytes Absolute 1.1 (H) 0.1 - 1.0 K/uL   Eosinophils Relative 3 %   Eosinophils Absolute 0.3 0.0 - 0.5 K/uL   Basophils Relative 0 %   Basophils Absolute 0.0 0.0 - 0.1 K/uL   Immature Granulocytes 0 %   Abs Immature Granulocytes 0.04 0.00 - 0.07 K/uL  Comprehensive metabolic panel     Status: Abnormal   Collection Time: 09/04/24  4:23 PM  Result Value Ref Range   Sodium 140 135 - 145 mmol/L   Potassium 3.9 3.5 - 5.1 mmol/L   Chloride 107 98 - 111 mmol/L   CO2 24 22 - 32 mmol/L   Glucose, Bld 103 (H) 70 - 99 mg/dL   BUN 22 8 - 23 mg/dL   Creatinine, Ser 8.99 0.61 - 1.24 mg/dL   Calcium 8.2 (L) 8.9 - 10.3 mg/dL   Total Protein 6.9 6.5 - 8.1 g/dL   Albumin 2.7 (L) 3.5 - 5.0 g/dL   AST 14 (L) 15 - 41 U/L   ALT 15 0 - 44 U/L   Alkaline Phosphatase 74 38 - 126 U/L   Total Bilirubin 0.6 0.0 - 1.2 mg/dL   GFR, Estimated >39 >39 mL/min   Anion gap 9 5 - 15  POC occult blood, ED Provider will collect     Status: Abnormal   Collection Time: 09/04/24  4:39 PM  Result Value Ref Range   Fecal Occult Bld POSITIVE (A) NEGATIVE  Type and screen [facility]     Status: None   Collection Time:  09/04/24  4:49 PM  Result Value Ref Range   ABO/RH(D) O POS    Antibody Screen NEG    Sample Expiration      09/07/2024,2359 Performed at [facility]  Lab, [address] [place], [place] [address]      Assessment and Plan: Ongoing rectal bleeding while on Xarelto - Hgb remained stable - [facility] consulted - Discontinue Xarelto and monitor - No colonoscopy planned at this time - Regular diet - Consider CTA if bleeding continues.  There appears to be quite a bit of blood based on the pictures from the nursing facility but the patient's hemoglobin remained stable.  2.  Atrial fibrillation - Risk of stroke while off Xarelto explained to the patient's son. He understands  3.  Dementia - stable.  Monitor for delirium   Advance Care Planning:   Code Status: Limited: Do not attempt resuscitation (DNR) -DNR-LIMITED -Do Not Intubate/DNI the patient son says the patient is DNR and has paperwork drawn up for that.  Consults: GI  Family Communication: Patient's son at bedside  Severity of Illness: The appropriate patient status for this patient is INPATIENT. Inpatient status is judged to be reasonable and necessary in order to provide the required intensity of service to ensure the patient's safety. The patient's presenting symptoms, physical exam findings, and initial radiographic and laboratory data in the context of their chronic comorbidities is felt to place them at high risk for further clinical deterioration. Furthermore, it is not anticipated that the patient will be medically stable for discharge from the hospital within 2 midnights of admission.   * I certify that at the point of admission it is my clinical judgment that the patient will require inpatient hospital care spanning beyond 2 midnights from the point of admission due to high intensity of service, high risk for further deterioration and high frequency of surveillance required.*  Author: ARTHEA CHILD,  MD 09/04/2024 8:48 PM  For on call review [email or web address].

## 2024-09-05 DIAGNOSIS — K625 Hemorrhage of anus and rectum: Secondary | ICD-10-CM | POA: Diagnosis not present

## 2024-09-05 DIAGNOSIS — K649 Unspecified hemorrhoids: Secondary | ICD-10-CM | POA: Diagnosis not present

## 2024-09-05 DIAGNOSIS — Z7901 Long term (current) use of anticoagulants: Secondary | ICD-10-CM | POA: Diagnosis not present

## 2024-09-05 LAB — BASIC METABOLIC PANEL WITH GFR
Anion gap: 8 (ref 5–15)
BUN: 19 mg/dL (ref 8–23)
CO2: 26 mmol/L (ref 22–32)
Calcium: 8 mg/dL — ABNORMAL LOW (ref 8.9–10.3)
Chloride: 108 mmol/L (ref 98–111)
Creatinine, Ser: 0.9 mg/dL (ref 0.61–1.24)
GFR, Estimated: >60 mL/min (ref >60)
Glucose, Bld: 90 mg/dL (ref 70–99)
Potassium: 3.9 mmol/L (ref 3.5–5.1)
Sodium: 142 mmol/L (ref 135–145)

## 2024-09-05 LAB — CBC
HCT: 28.3 % — ABNORMAL LOW (ref 39.0–52.0)
Hemoglobin: 9 g/dL — ABNORMAL LOW (ref 13.0–17.0)
MCH: 30 pg (ref 26.0–34.0)
MCHC: 31.8 g/dL (ref 30.0–36.0)
MCV: 94.3 fL (ref 80.0–100.0)
Platelets: 256 K/uL (ref 150–400)
RBC: 3 MIL/uL — ABNORMAL LOW (ref 4.22–5.81)
RDW: 13.9 % (ref 11.5–15.5)
WBC: 7 K/uL (ref 4.0–10.5)
nRBC: 0 % (ref 0.0–0.2)

## 2024-09-05 MED ORDER — HYDROCORTISONE ACETATE 25 MG RE SUPP
25.0000 mg | Freq: Two times a day (BID) | RECTAL | Status: AC
Start: 2024-09-05 — End: 2024-09-07
  Administered 2024-09-05 – 2024-09-07 (×4): 25 mg via RECTAL
  Filled 2024-09-05 (×5): qty 1

## 2024-09-05 MED ORDER — AMLODIPINE BESYLATE 2.5 MG PO TABS
2.5000 mg | ORAL_TABLET | Freq: Every day | ORAL | Status: DC
  Administered 2024-09-05 – 2024-09-07 (×3): 2.5 mg via ORAL
  Filled 2024-09-05 (×3): qty 1

## 2024-09-05 MED ORDER — SODIUM CHLORIDE 0.9 % IV SOLN: INTRAVENOUS | Status: AC

## 2024-09-05 NOTE — ED Notes (Signed)
 Pt had episode of black, liquid and solid stool.

## 2024-09-05 NOTE — ED Notes (Signed)
 Pt had large episode of dark, loose stool

## 2024-09-05 NOTE — ED Notes (Signed)
 Pt had episode of dark loose stool.

## 2024-09-05 NOTE — Progress Notes (Signed)
 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 Cordella Needles and is on Aricept for this indication Rate controlled permanent A-fib CHADVASC >4 on Xarelto--- however has history of falls  Last MMSE 27/30 in 2018, last clock draw test 4/4 [I cannot see any further notes from care everywhere] Normocytic iron deficiency anemia, concomitant B12 deficiency on B12 Hypertension  9/1 [facility] transfer to urgency room for blood clots from rectum-hemoglobin was 10 patient was treated for UTI and sent back to his facility 9/8 Re-presented with recurrence of bleeding-vital signs were normal-he was unable to provide history-DRE by ED provider no melena or further bleeding lab work reassuring he was discharged from [facility]-blood counts were stable 9/9 PM represented it was thought that he had hemorrhoidal bleeding and he continued on Xarelto  In ED given bolus of fluids-hemoglobin 10.7-[facility] consulted No imaging/EKG performed on admit   Assessment  & Plan :    Lower GI bleed  patient on exam has a 12:00 hemorrhoid with MASD in intergluteal fold I did not appreciate any real bleeding--he is also on oral iron which can make stools appear dark and I have held this for now Both aspirin 81 as well as Xarelto 20 have been held from admission Await GI input-unclear if they are planning any intervention-clear liquid diet to facilitate that-if he is going for an intervention this would allow for that Permanent A-fib on Xarelto CHADVASC >4 has bled score >3 (falls, bleeding) Need to have risk-benefit discussions with family with regards to continued Xarelto Dependent on GI input he  could have banding of the hemorrhoid and continue it?  His rectal was positive for fecal occult blood 9/9 Defer to GI Alzheimer's dementia Seems to be chronic-continues on sertraline  50 Apparently not on Aricept any longer Bimodal anemia--dilutional component additionally as is on IV fluid He is not severely anemic-transfusion trigger is below 7 Obtain iron studies next lab draw Recent UTI Completed Keflex  hold further dosing Hypertension history Resume amlodipine  2.5 holding Cozaar 50     No family present at this time will discuss with them once the plan per GI is obtained   Data Reviewed:   WBC 7.0 hemoglobin 9.0 platelet 256   DVT prophylaxis: SCD  Status is: Inpatient Remains inpatient appropriate because: Requires stabilization-expect to go back to [facility] at discharge which is where he is left     Current Dispo: Inpatient     Subjective:   Cannot obtain reliable ROS Asking for his car to be able to get home-does not know where he is No chest pain no fever that I can tell    Objective + exam Vitals:   09/05/24 0200 09/05/24 0340 09/05/24 0400 09/05/24 0500  BP: 119/61 137/71  (!) 151/73  Pulse: 75 84  72  Resp: (!) 21 15  17   Temp:   98 F (36.7 C)   TempSrc:      SpO2: 100% 100%  100%   There were no vitals filed for this visit.   Examination: Pleasant male looking about stated age poor dentition S1-S2 no murmur Chest is clear Abdomen is soft No lower extremity  edema 12:00 hemorrhoid no bleeding noted     Scheduled Meds:  amLODipine   2.5 mg Oral Daily   pantoprazole   40 mg Oral Daily   sertraline   50 mg Oral QHS   Continuous Infusions:  sodium chloride  50 mL/hr at 09/05/24 0553    Time 70  Colen Grimes, MD  [facility]

## 2024-09-05 NOTE — ED Notes (Signed)
 Patients soiled linens removed. Patient cleaned up and new sheets and blankets given.

## 2024-09-05 NOTE — Plan of Care (Signed)

## 2024-09-05 NOTE — Consult Note (Addendum)
 Consultation  Referring Provider: [facility]/ Samtani Primary Care Physician:  Amin, Saad, MD Primary Gastroenterologist:  Dr.Butler [phone] [place]  Reason for Consultation: Rectal bleeding  HPI: Alexander Bradley is a 83 y.o. male, nursing home resident/[facility] with history of Alzheimer's dementia, hypertension, atrial hip/flutter, and anemia.  He is maintained on Xarelto. He was seen in the emergency room on 08/27/2024 for blood in his stoo/clot in stool.  Stool was documented to be brown at that time per the ER doc and labs were stable, he was treated for UTI and discharged back to the nursing facility. Also brought back on 09/03/2024 for rectal bleeding, and external hemorrhoid noted per ER MD, hemoglobin stable at 9.8.  There was no melena on exam and he was discharged back to the facility. Brought back again last p.m. again with noticing blood in his stool.  Per notes patient was unaware why he was brought to the emergency room Exam per ER MD showed a small hemorrhoid without thrombosis or signs of obvious bleeding there was some bright red blood noted in his brief rectal exam showed brown stool, no melena Hemoglobin stable at 10.7 Stool documented Hemoccult positive. Decision was made to admit him due to him repeatedly being sent back to the emergency room by the nursing facility.  Patient is alert, tries to answer questions but very difficult to understand much of what he is saying.  He is not having any abdominal discomfort or rectal discomfort.  He seems to be aware that blood has been noticed in his bowel. 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  Labs 09/04/2024-WBC 9.4/hemoglobin 10.7/hematocrit 34.3-stable since 08/27/2024-globin is prior to that in the 11 range Potassium 3.9/BUN 22/creatinine 1.0 LFTs within normal limits  INR from 09/03/2024 was elevated at 2.8 (Xarelto)  Labs today WBC 7.0/hemoglobin 9.0/hematocrit 28.3/platelets 256 Anemia panel pending  Per notes does have history of iron deficiency and B12 deficiency.       Past Medical History:  Diagnosis Date   Encephalopathy    Hypertension    Kidney disease    Respiratory abnormality     Past Surgical History:  Procedure Laterality Date   PET ALZHEIMER/DEMENTIA STUDY ([facility] HX)      Prior to Admission medications   Medication Sig Start Date End Date Taking? Authorizing Provider  acetaminophen  (TYLENOL ) 325 MG tablet Take 650 mg by mouth every 6 (six) hours as needed (pain).   Yes [provider]  amLODipine  (NORVASC ) 2.5 MG tablet Take 2.5 mg by mouth daily.   Yes [provider]  aspirin 81 MG chewable tablet Chew 81 mg by mouth daily.   Yes [provider]  Cholecalciferol (VITAMIN D3) 1000 units CAPS Take 1,000 Units by mouth daily.   Yes [provider]  losartan (COZAAR) 50 MG tablet Take 50 mg by mouth every evening.   Yes [provider]  pantoprazole  (PROTONIX ) 40 MG tablet Take 40 mg by mouth daily.   Yes [provider]  rivaroxaban (XARELTO) 20 MG TABS tablet Take 20 mg by mouth every evening.   Yes [provider]  senna (SENOKOT) 8.6 MG TABS tablet Take 17.2  mg by mouth daily.   Yes [provider]  sertraline  (ZOLOFT ) 50 MG tablet Take 50 mg by mouth at bedtime.   Yes [provider]    Current Facility-Administered Medications  Medication Dose Route Frequency Provider Last Rate Last Admin   0.9 %  sodium chloride  infusion   Intravenous Continuous Arthea Child, MD 50 mL/hr at 09/05/24 0553 New Bag at 09/05/24 0553   amLODipine  (NORVASC ) tablet 2.5 mg   2.5 mg Oral Daily Samtani, Jai-Gurmukh, MD       pantoprazole  (PROTONIX ) EC tablet 40 mg  40 mg Oral Daily Claiborne, Claudia, MD       sertraline  (ZOLOFT ) tablet 50 mg  50 mg Oral QHS Claiborne, Claudia, MD   50 mg at 09/05/24 0020   Current Outpatient Medications  Medication Sig Dispense Refill   acetaminophen  (TYLENOL ) 325 MG tablet Take 650 mg by mouth every 6 (six) hours as needed (pain).     amLODipine  (NORVASC ) 2.5 MG tablet Take 2.5 mg by mouth daily.     aspirin 81 MG chewable tablet Chew 81 mg by mouth daily.     Cholecalciferol (VITAMIN D3) 1000 units CAPS Take 1,000 Units by mouth daily.     losartan (COZAAR) 50 MG tablet Take 50 mg by mouth every evening.     pantoprazole  (PROTONIX ) 40 MG tablet Take 40 mg by mouth daily.     rivaroxaban (XARELTO) 20 MG TABS tablet Take 20 mg by mouth every evening.     senna (SENOKOT) 8.6 MG TABS tablet Take 17.2 mg by mouth daily.     sertraline  (ZOLOFT ) 50 MG tablet Take 50 mg by mouth at bedtime.      Allergies as of 09/04/2024 - Review Complete 09/04/2024  Allergen Reaction Noted   Transderm-scop [scopolamine] Other (See Comments) 07/05/2023    History reviewed. No pertinent family history.  Social History   Socioeconomic History   Marital status: Married    Spouse name: Not on file   Number of children: Not on file   Years of education: BS   Highest education level: Not on file  Occupational History   Occupation: retired  Tobacco Use   Smoking status: Never   Smokeless tobacco: Never  Substance and Sexual Activity   Alcohol use: No   Drug use: No   Sexual activity: Not on file  Other Topics Concern   Not on file  Social History Narrative   Married for 57 yrs, wife is now in a SNF, he lives alone, mentioned he does not like it at all.   Has a one story home, no pets.   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   Social Drivers of Health   Financial Resource Strain:  Not on file  Food Insecurity: No Food Insecurity (07/05/2023)   Hunger Vital Sign    Worried About Running Out of Food in the Last Year: Never true    Ran Out of Food in the Last Year: Never true  Transportation Needs: No Transportation Needs (07/05/2023)   PRAPARE - Administrator, Civil Service (Medical): No    Lack of Transportation (Non-Medical): No  Physical Activity: Not on file  Stress: Not on file  Social Connections: Not on file  Intimate Partner Violence: Not At Risk (07/05/2023)   Humiliation, Afraid, Rape, and Kick questionnaire    Fear of Current or Ex-Partner: No    Emotionally Abused: No  Physically Abused: No    Sexually Abused: No    Review of Systems: Patient unable to offer.  Physical Exam: Vital signs in last 24 hours: Temp:  [98 F (36.7 C)-98.9 F (37.2 C)] 98.2 F (36.8 C) (09/10 0835) Pulse Rate:  [72-88] 88 (09/10 0600) Resp:  [15-21] 18 (09/10 0600) BP: (96-151)/(52-90) 143/65 (09/10 0600) SpO2:  [99 %-100 %] 100 % (09/10 0600) Last BM Date : 09/05/24 General:   Alert,  Well-developed, chronically ill-appearing very elderly white male pleasant and cooperative in NAD Head:  Normocephalic and atraumatic. Eyes:  Sclera clear, no icterus.   Conjunctiva pink. Ears:  Normal auditory acuity. Nose:  No deformity, discharge,  or lesions. Mouth:  No deformity or lesions.   Neck:  Supple; no masses or thyromegaly. Lungs:  Clear throughout to auscultation.   No wheezes, crackles, or rhonchi.  Heart:  Regular rate and rhythm; no murmurs, clicks, rubs,  or gallops. Abdomen:  Soft,nontender, BS active,nonpalp mass or hsm.   Rectal: Brown stool per ER MD small hemorrhoid,--mucoid scant heme with brown scant stool per my exam Msk:  Symmetrical without gross deformities. . Pulses:  Normal pulses noted. Extremities:  Without clubbing or edema. Neurologic:  Alert and  oriented x1 advanced Alzheimer's, communicative but very difficult to understand Skin:   Intact without significant lesions or rashes.. Psych:  Alert and cooperative. Normal mood and affect.  Intake/Output from previous day: 09/09 0701 - 09/10 0700 In: 278.8 [IV Piggyback:278.8] Out: -  Intake/Output this shift: No intake/output data recorded.  Lab Results: Recent Labs    09/03/24 2005 09/04/24 1623 09/05/24 0611  WBC 9.7 9.4 7.0  HGB 9.8* 10.7* 9.0*  HCT 31.4* 34.3* 28.3*  PLT 271 282 256   BMET Recent Labs    09/03/24 2001 09/04/24 1623 09/05/24 0611  NA 141 140 142  K 4.1 3.9 3.9  CL 108 107 108  CO2 25 24 26   GLUCOSE 115* 103* 90  BUN 22 22 19   CREATININE 1.11 1.00 0.90  CALCIUM 8.4* 8.2* 8.0*   LFT Recent Labs    09/04/24 1623  PROT 6.9  ALBUMIN 2.7*  AST 14*  ALT 15  ALKPHOS 74  BILITOT 0.6   PT/INR Recent Labs    09/03/24 2005  LABPROT 31.0*  INR 2.8*   Hepatitis Panel No results for input(s): HEPBSAG, HCVAB, HEPAIGM, HEPBIGM in the last 72 hours.    IMPRESSION:  #36 83 year old white male with advanced Alzheimer's currently residing in skilled nursing facility with 3 recent ER visits due to concerns for rectal bleeding with blood noted in stool  2 previous rectal exams per ER MD's in the past week with finding of an external hemorrhoid, brown stool Hemoglobins overall have been stable  Patient is on Xarelto, with INR of 2.6  2 days ago, certainly may be exacerbating hemorrhoidal bleeding  .  Suspect that his bleeding is from a local anal rectal source likely hemorrhoidal, cannot rule out other colonic lesion. Contrasted CT scan 1 year ago with diverticulosis, no other colonic abnormality noted  #2 chronic anticoagulation/Xarelto #3 history of atrial fibs/flutter #4 reported history of iron deficiency and B12 deficiency labs pending   PLAN: Patient has not manifested any active GI bleeding over the course of the week with relatively stable hemoglobins in the setting of an elevated INR with Xarelto Would hold his  Xarelto Will need to have further discussion with his family about leaving him off of Xarelto which may be appropriate  given his advanced Alzheimer's  Would be very hesitant to put this patient through any endoscopic evaluation i.e. sigmoidoscopy or colonoscopy but will need to discuss with his family regarding their preferences for aggressive management For now okay for regular diet Anusol  suppositories twice daily Continue to follow hemoglobin   Amy Esterwood PA-C 09/05/2024, 8:40 AM   Attending physician's note  I personally saw the patient and performed a substantive portion of the medical decision making process for this encounter (including a complete performance of the key components : MDM, Hx and Exam), in conjunction with the APP.  I agree with the APP's note, impression, and  the management plan for the number and complexity of problems addressed at the encounter for the patient and take responsibility for that plan with its inherent risk of complications, morbidity, or mortality with additional input as follows.     83 year old male with Alzheimer's dementia atrial flutter brought in from nursing home with rectal bleeding He is on chronic Xarelto  No further episodes of bleeding since he arrived in the hospital.  Hemoglobin stable at baseline  On exam abdomen soft no tenderness or distention  INR elevated 2.6, discussed with cardiology/PCP if patient can come off chronic anticoagulation with Xarelto  He is at risk for higher mortality with anesthesia or procedures, recommend conservative management Anusol  suppository twice daily as needed for 5 to 7 days Continue diet as tolerated Monitor hemoglobin  GI signing off, please call GI back if needed    The patient was provided an opportunity to ask questions and all were answered. The patient agreed with the plan and demonstrated an understanding of the instructions.  LOIS Wilkie Mcgee , MD [phone]

## 2024-09-06 DIAGNOSIS — K625 Hemorrhage of anus and rectum: Secondary | ICD-10-CM | POA: Diagnosis not present

## 2024-09-06 LAB — CBC WITH DIFFERENTIAL/PLATELET
Abs Immature Granulocytes: 0.03 K/uL (ref 0.00–0.07)
Basophils Absolute: 0 K/uL (ref 0.0–0.1)
Basophils Relative: 1 %
Eosinophils Absolute: 0.2 K/uL (ref 0.0–0.5)
Eosinophils Relative: 4 %
HCT: 25.5 % — ABNORMAL LOW (ref 39.0–52.0)
Hemoglobin: 8.2 g/dL — ABNORMAL LOW (ref 13.0–17.0)
Immature Granulocytes: 1 %
Lymphocytes Relative: 24 %
Lymphs Abs: 1.3 K/uL (ref 0.7–4.0)
MCH: 30 pg (ref 26.0–34.0)
MCHC: 32.2 g/dL (ref 30.0–36.0)
MCV: 93.4 fL (ref 80.0–100.0)
Monocytes Absolute: 0.9 K/uL (ref 0.1–1.0)
Monocytes Relative: 17 %
Neutro Abs: 2.9 K/uL (ref 1.7–7.7)
Neutrophils Relative %: 53 %
Platelets: 251 K/uL (ref 150–400)
RBC: 2.73 MIL/uL — ABNORMAL LOW (ref 4.22–5.81)
RDW: 13.9 % (ref 11.5–15.5)
WBC: 5.3 K/uL (ref 4.0–10.5)
nRBC: 0 % (ref 0.0–0.2)

## 2024-09-06 LAB — BASIC METABOLIC PANEL WITH GFR
Anion gap: 11 (ref 5–15)
BUN: 13 mg/dL (ref 8–23)
CO2: 21 mmol/L — ABNORMAL LOW (ref 22–32)
Calcium: 7.9 mg/dL — ABNORMAL LOW (ref 8.9–10.3)
Chloride: 110 mmol/L (ref 98–111)
Creatinine, Ser: 0.92 mg/dL (ref 0.61–1.24)
GFR, Estimated: >60 mL/min (ref >60)
Glucose, Bld: 87 mg/dL (ref 70–99)
Potassium: 3.9 mmol/L (ref 3.5–5.1)
Sodium: 142 mmol/L (ref 135–145)

## 2024-09-06 LAB — IRON AND TIBC
Iron: 39 ug/dL — ABNORMAL LOW (ref 45–182)
Saturation Ratios: 25 % (ref 17.9–39.5)
TIBC: 157 ug/dL — ABNORMAL LOW (ref 250–450)
UIBC: 118 ug/dL

## 2024-09-06 LAB — RETICULOCYTES
Immature Retic Fract: 15.3 % (ref 2.3–15.9)
RBC.: 2.72 MIL/uL — ABNORMAL LOW (ref 4.22–5.81)
Retic Count, Absolute: 46.5 K/uL (ref 19.0–186.0)
Retic Ct Pct: 1.7 % (ref 0.4–3.1)

## 2024-09-06 LAB — FERRITIN: Ferritin: 52 ng/mL (ref 24–336)

## 2024-09-06 LAB — FOLATE: Folate: 15 ng/mL (ref >5.9)

## 2024-09-06 LAB — HEMOGLOBIN AND HEMATOCRIT, BLOOD
HCT: 27.3 % — ABNORMAL LOW (ref 39.0–52.0)
Hemoglobin: 8.7 g/dL — ABNORMAL LOW (ref 13.0–17.0)

## 2024-09-06 LAB — VITAMIN B12: Vitamin B-12: 403 pg/mL (ref 180–914)

## 2024-09-06 MED ORDER — DEXTROMETHORPHAN POLISTIREX ER 30 MG/5ML PO SUER
15.0000 mg | Freq: Two times a day (BID) | ORAL | Status: DC
  Administered 2024-09-06 – 2024-09-07 (×3): 15 mg via ORAL
  Filled 2024-09-06 (×4): qty 5

## 2024-09-06 NOTE — Consult Note (Addendum)
 WOC Nurse Consult Note: Reason for Consult: Consult requested for bilat buttocks and sacrum.  Pt has red moist macerated skin and patchy areas of full thickness skin loss; appearance is consistent with a combination of moisture associated skin damage and shear.  2 areas of red moist Stage 2 pressure injuries to bilat buttocks, each is approx .5X.5X.1cm.  Pressure Injury POA: Yes Dressing procedure/placement/frequency: Topical treatment orders provided for bedside nurses to perform as follows: Foam dressing to sacrum and bilat buttocks.  Change Q 3 days or PRN when soiled.   Please re-consult if further assistance is needed.  Thank-you,  Stephane Fought MSN, RN, CWOCN, CWCN-AP, CNS Contact Mon-Fri 0700-1500: [phone]

## 2024-09-06 NOTE — Plan of Care (Signed)

## 2024-09-06 NOTE — Progress Notes (Signed)
 [facility]   ROUNDING   NOTE Alexander Bradley FMW:[id number]  DOB: [birth year]  DOA: 09/04/2024  PCP: Caleen Dirks, MD  09/06/2024,11:12 AM  LOS: 2 days    Code Status: DNR     from: Skilled nursing facility-[facility]   83 year old male Prior hospitalizations for mild encephalopathy superimposed on chronic Alzheimer's-is managed by [facility] in [place] by Dr. Cordella Needles and is on Aricept for this indication Rate controlled permanent A-fib CHADVASC >4 on Xarelto--- however has history of falls  Last MMSE 27/30 in 2018, last clock draw test 4/4 [I cannot see any further notes from care everywhere] Normocytic iron deficiency anemia, concomitant B12 deficiency on B12 Hypertension Perm AFib/DVTin the past?  9/1 [facility] transfer to urgency room for blood clots from rectum-hemoglobin was 10 patient was treated for UTI and sent back to his facility 9/8 Re-presented with recurrence of bleeding-vital signs were normal-he was unable to provide history-DRE by ED provider no melena or further bleeding lab work reassuring he was discharged from [facility]-blood counts were stable 9/9 PM represented it was thought that he had hemorrhoidal bleeding and he continued on Xarelto  In ED given bolus of fluids-hemoglobin 10.7-[facility] consulted No imaging/EKG performed on admit     Assessment  & Plan :    Lower GI bleed Likely hemorrhoidal in etiology Drop overnight in hemoglobin---will rpt 14:00 Heme Nursing reports stg 5 bristol brown stool with a small amount of blood streaked at the end  No further workup per GI, agree with Anusol  suppository short-term Permanent A-fib on Xarelto CHADVASC >4 has bled score >3 (falls, bleeding) Holding Xarelto and ASA Discussed with patient's son Alzheimer's dementia Seems to be chronic-continues on sertraline  50 Apparently not on Aricept any longer Bimodal anemia--dilutional component additionally as is on IV fluid He is not severely  anemic-transfusion trigger is below 7 Iron sat doesn't support IV iron.  OP re-eval Recent UTI Completed Keflex  hold further dosing Hypertension history Resume amlodipine  2.5 holding Cozaar 50  Discussed and updated patient's son Ennis Heavner [id number]   Data Reviewed:   Sodium 142 potassium 3.9 CO2 21 BUN/creatinine 13/0.9 Hemoglobin 8.2 WBC 5.3 platelet 251 Iron level 39 saturation ratios 25   DVT prophylaxis: SCD  Status is: Inpatient Remains inpatient appropriate because: Requires stabilization-expect to go back to [facility] at discharge which is where he is left    Current Dispo: Inpatient     Subjective:   No ROS unobtainable He looks relatively stable but is coughing a little bit   Objective + exam Vitals:   09/05/24 2033 09/06/24 0007 09/06/24 0352 09/06/24 0951  BP: 132/63 (!) 151/91 137/69 (!) 142/76  Pulse: 87 90 78 74  Resp:  18 18 17   Temp: 97.9 F (36.6 C) 98.1 F (36.7 C) 98.2 F (36.8 C) 98.9 F (37.2 C)  TempSrc: Oral Axillary Axillary   SpO2: 97% 95% 99% 98%   There were no vitals filed for this visit.   Examination:  Pleasant male age poor dentition S1-S2 no murmur, NSR Chest is clear Abdomen is soft No lower extremity edema 12:00 hemorrhoid no bleeding noted  Scheduled Meds:  amLODipine   2.5 mg Oral Daily   hydrocortisone   25 mg Rectal BID   pantoprazole   40 mg Oral Daily   sertraline   50 mg Oral QHS   Continuous Infusions:    Time 40  Jai-Gurmukh Tobey Schmelzle, MD  [facility]

## 2024-09-06 NOTE — Plan of Care (Signed)
 Patient turned side to side q2, had BM 3x on my shift without any evidence of blood.  Problem: Education: Goal: Knowledge of General Education information will improve Description: Including pain rating scale, medication(s)/side effects and non-pharmacologic comfort measures Outcome: Progressing   Problem: Health Behavior/Discharge Planning: Goal: Ability to manage health-related needs will improve Outcome: Progressing   Problem: Clinical Measurements: Goal: Ability to maintain clinical measurements within normal limits will improve Outcome: Progressing Goal: Will remain free from infection Outcome: Progressing Goal: Diagnostic test results will improve Outcome: Progressing Goal: Respiratory complications will improve Outcome: Progressing Goal: Cardiovascular complication will be avoided Outcome: Progressing   Problem: Activity: Goal: Risk for activity intolerance will decrease Outcome: Progressing   Problem: Nutrition: Goal: Adequate nutrition will be maintained Outcome: Progressing   Problem: Coping: Goal: Level of anxiety will decrease Outcome: Progressing   Problem: Elimination: Goal: Will not experience complications related to bowel motility Outcome: Progressing Goal: Will not experience complications related to urinary retention Outcome: Progressing   Problem: Pain Managment: Goal: General experience of comfort will improve and/or be controlled Outcome: Progressing   Problem: Safety: Goal: Ability to remain free from injury will improve Outcome: Progressing   Problem: Skin Integrity: Goal: Risk for impaired skin integrity will decrease Outcome: Progressing

## 2024-09-07 DIAGNOSIS — K625 Hemorrhage of anus and rectum: Secondary | ICD-10-CM | POA: Diagnosis not present

## 2024-09-07 LAB — CBC WITH DIFFERENTIAL/PLATELET
Abs Immature Granulocytes: 0.03 K/uL (ref 0.00–0.07)
Basophils Absolute: 0 K/uL (ref 0.0–0.1)
Basophils Relative: 1 %
Eosinophils Absolute: 0.3 K/uL (ref 0.0–0.5)
Eosinophils Relative: 5 %
HCT: 25 % — ABNORMAL LOW (ref 39.0–52.0)
Hemoglobin: 8.3 g/dL — ABNORMAL LOW (ref 13.0–17.0)
Immature Granulocytes: 1 %
Lymphocytes Relative: 20 %
Lymphs Abs: 1.3 K/uL (ref 0.7–4.0)
MCH: 31 pg (ref 26.0–34.0)
MCHC: 33.2 g/dL (ref 30.0–36.0)
MCV: 93.3 fL (ref 80.0–100.0)
Monocytes Absolute: 1 K/uL (ref 0.1–1.0)
Monocytes Relative: 15 %
Neutro Abs: 3.8 K/uL (ref 1.7–7.7)
Neutrophils Relative %: 58 %
Platelets: 244 K/uL (ref 150–400)
RBC: 2.68 MIL/uL — ABNORMAL LOW (ref 4.22–5.81)
RDW: 13.9 % (ref 11.5–15.5)
WBC: 6.4 K/uL (ref 4.0–10.5)
nRBC: 0 % (ref 0.0–0.2)

## 2024-09-07 LAB — BASIC METABOLIC PANEL WITH GFR
Anion gap: 10 (ref 5–15)
BUN: 14 mg/dL (ref 8–23)
CO2: 24 mmol/L (ref 22–32)
Calcium: 7.8 mg/dL — ABNORMAL LOW (ref 8.9–10.3)
Chloride: 108 mmol/L (ref 98–111)
Creatinine, Ser: 1.08 mg/dL (ref 0.61–1.24)
GFR, Estimated: >60 mL/min (ref >60)
Glucose, Bld: 90 mg/dL (ref 70–99)
Potassium: 3.7 mmol/L (ref 3.5–5.1)
Sodium: 142 mmol/L (ref 135–145)

## 2024-09-07 MED ORDER — SERTRALINE HCL 50 MG PO TABS: 50.0000 mg | ORAL_TABLET | Freq: Every day | ORAL | 0 refills | Status: AC

## 2024-09-07 MED ORDER — HYDROCORTISONE ACETATE 25 MG RE SUPP: 25.0000 mg | Freq: Two times a day (BID) | RECTAL | Status: AC

## 2024-09-07 MED ORDER — PANTOPRAZOLE SODIUM 40 MG PO TBEC: 40.0000 mg | DELAYED_RELEASE_TABLET | Freq: Two times a day (BID) | ORAL | Status: AC

## 2024-09-07 MED ORDER — DEXTROMETHORPHAN POLISTIREX ER 30 MG/5ML PO SUER: 15.0000 mg | Freq: Two times a day (BID) | ORAL | 0 refills | Status: AC

## 2024-09-07 NOTE — Plan of Care (Signed)

## 2024-09-07 NOTE — Hospital Course (Signed)
 Consult requested for bilat buttocks and sacrum.  Pt has red moist macerated skin and patchy areas of full thickness skin loss; appearance is consistent with a combination of moisture associated skin damage and shear.  2 areas of red moist Stage 2 pressure injuries to bilat buttocks, each is approx .5X.5X.1cm.  Pressure Injury POA: Yes Dressing procedure/placement/frequency: Topical treatment orders provided for bedside nurses to perform as follows: Foam dressing to sacrum and bilat buttocks.  Change Q 3 days or PRN when soiled.

## 2024-09-07 NOTE — Care Management Important Message (Signed)
 Important Message  Patient Details  Name: Alexander Bradley MRN: [id number] Date of Birth: Aug 16, 1941   Important Message Given:  Yes - Medicare IM     Jon Cruel 09/07/2024, 1:14 PM

## 2024-09-07 NOTE — Plan of Care (Signed)
  Problem: Education: Goal: Knowledge of General Education information will improve Description: Including pain rating scale, medication(s)/side effects and non-pharmacologic comfort measures 09/07/2024 1858 by Riya Huxford K, RN Outcome: Adequate for Discharge 09/07/2024 1814 by Florian Dellis POUR, RN Outcome: Progressing   Problem: Health Behavior/Discharge Planning: Goal: Ability to manage health-related needs will improve 09/07/2024 1858 by Krissia Schreier K, RN Outcome: Adequate for Discharge 09/07/2024 1814 by Florian Dellis POUR, RN Outcome: Progressing   Problem: Clinical Measurements: Goal: Ability to maintain clinical measurements within normal limits will improve 09/07/2024 1858 by Landri Dorsainvil K, RN Outcome: Adequate for Discharge 09/07/2024 1814 by Florian Dellis POUR, RN Outcome: Progressing Goal: Will remain free from infection 09/07/2024 1858 by Ahnesti Townsend K, RN Outcome: Adequate for Discharge 09/07/2024 1814 by Braxton Vantrease K, RN Outcome: Progressing Goal: Diagnostic test results will improve 09/07/2024 1858 by Skyelar Halliday K, RN Outcome: Adequate for Discharge 09/07/2024 1814 by Florian Dellis POUR, RN Outcome: Progressing Goal: Respiratory complications will improve 09/07/2024 1858 by Dionysios Massman K, RN Outcome: Adequate for Discharge 09/07/2024 1814 by Jakyrie Totherow K, RN Outcome: Progressing Goal: Cardiovascular complication will be avoided 09/07/2024 1858 by Sarit Sparano K, RN Outcome: Adequate for Discharge 09/07/2024 1814 by Yarixa Lightcap K, RN Outcome: Progressing   Problem: Activity: Goal: Risk for activity intolerance will decrease 09/07/2024 1858 by Lirio Bach K, RN Outcome: Adequate for Discharge 09/07/2024 1814 by Florian Dellis POUR, RN Outcome: Progressing   Problem: Nutrition: Goal: Adequate nutrition will be maintained 09/07/2024 1858 by Oluwatobiloba Martin K, RN Outcome: Adequate for Discharge 09/07/2024 1814 by Florian Dellis POUR, RN Outcome:  Progressing   Problem: Coping: Goal: Level of anxiety will decrease 09/07/2024 1858 by Aerion Bagdasarian K, RN Outcome: Adequate for Discharge 09/07/2024 1814 by Florian Dellis POUR, RN Outcome: Progressing   Problem: Elimination: Goal: Will not experience complications related to bowel motility 09/07/2024 1858 by Anjuli Gemmill K, RN Outcome: Adequate for Discharge 09/07/2024 1814 by Florian Dellis POUR, RN Outcome: Progressing Goal: Will not experience complications related to urinary retention 09/07/2024 1858 by Hajira Verhagen K, RN Outcome: Adequate for Discharge 09/07/2024 1814 by Florian Dellis POUR, RN Outcome: Progressing   Problem: Pain Managment: Goal: General experience of comfort will improve and/or be controlled 09/07/2024 1858 by Kaylin Schellenberg K, RN Outcome: Adequate for Discharge 09/07/2024 1814 by Florian Dellis POUR, RN Outcome: Progressing   Problem: Safety: Goal: Ability to remain free from injury will improve 09/07/2024 1858 by Aliesha Dolata K, RN Outcome: Adequate for Discharge 09/07/2024 1814 by Florian Dellis POUR, RN Outcome: Progressing   Problem: Skin Integrity: Goal: Risk for impaired skin integrity will decrease 09/07/2024 1858 by Gracilyn Gunia K, RN Outcome: Adequate for Discharge 09/07/2024 1814 by Latashia Koch K, RN Outcome: Progressing

## 2024-09-07 NOTE — Discharge Summary (Signed)
 Physician Discharge Summary  Alexander Bradley FMW:[id number] DOB: 1941-11-15 DOA: 09/04/2024  PCP: Caleen Dirks, MD  Admit date: 09/04/2024 Discharge date: 09/07/2024  Time spent: 33 minutes  Recommendations for Outpatient Follow-up:  CBC Chem-12 1 week Do not resume aspirin DOAC Increase Protonix  to 40 twice daily Treat MASD buttocks etc. as per wound nurse recommendations--- needs every 2 turning for diarrhea-would not add as needed Lomotil until Anusol  suppositories are completed Refer to outpatient GI Dr. Nandigam for consideration/discussion of hemorrhoidal banding  Discharge Diagnoses:  MAIN problem for hospitalization   Probable hemorrhoidal bleed  Please see below for itemized issues addressed in HOpsital- refer to other progress notes for clarity if needed  Discharge Condition:  Improved  Diet recommendation:  Soft  There were no vitals filed for this visit.  History of present illness:  83 year old male Prior hospitalizations for mild encephalopathy superimposed on chronic Alzheimer's-is managed by [facility] in [place] by Dr. Cordella Needles and is on Aricept for this indication Rate controlled permanent A-fib CHADVASC >4 on Xarelto--- however has history of falls         Last MMSE 27/30 in 2018, last clock draw test 4/4 [I cannot see any further notes from care everywhere] Normocytic iron deficiency anemia, concomitant B12 deficiency on B12 Hypertension Perm AFib/DVTin the past?   9/1 [facility] transfer to urgency room for blood clots from rectum-hemoglobin was 10 patient was treated for UTI and sent back to his facility 9/8 Re-presented with recurrence of bleeding-vital signs were normal-he was unable to provide history-DRE by ED provider no melena or further bleeding lab work reassuring he was discharged from [facility]-blood counts were stable 9/9 PM represented it was thought that he had hemorrhoidal bleeding and he continued on Xarelto          In ED given bolus of fluids-hemoglobin 10.7-[facility] consulted No imaging/EKG performed on admit        Assessment  & Plan :      Lower GI bleed Likely hemorrhoidal in etiology Nursing reports stg 5 bristol brown stool with a small amount of blood streaked at the end  No further workup per GI, agree with Anusol  suppository short-term Patient will probably need outpatient follow-up with GI for consideration of banding and discussion of the same Permanent A-fib on Xarelto CHADVASC >4 has bled score >3 (falls, bleeding) Holding Xarelto and ASA going forward at least in the short-term Discussed with patient's son in detail on 9/11 and he understands the rationale and the risk and benefit of anticoagulation versus risk of bleeding with falls and risk of GI bleed Patient has moderate yearly risk for stroke without anticoagulation Alzheimer's dementia Seems to be chronic-continues on sertraline  50 Apparently not on Aricept any longer Bimodal anemia--dilutional component additionally as is on IV fluid He is not severely anemic-transfusion trigger is below 7 Iron sat doesn't support IV iron.  OP re-eval Hemoglobin has stayed very stable Sacral decubiti-dressings as per below Needs frequent turning has diarrhea-would not place patient on Lomotil for now until the hemorrhoid situation seems to be a little bit better without bleeding for at least a week May use Gerhard's Butt cream Recent UTI Completed Keflex  hold further dosing Hypertension history Resume amlodipine  2.5 Cozaar resumed at discharge    Discharge Exam: Vitals:   09/07/24 0814 09/07/24 1100  BP: 137/75 137/75  Pulse: 85   Resp: 17   Temp: 97.7 F (36.5 C)   SpO2: 97%  Subj on day of d/c   Getting lunch no distress Nursing reports loose BM no blood  General Exam on discharge  EOMI NCAT no icterus no pallor S1-S2 no murmur no rub CTAB Abdomen soft   Discharge Instructions   Discharge Instructions      Diet - low sodium heart healthy   Complete by: As directed    Discharge wound care:   Complete by: As directed    WOC Nurse Consult Note: Reason for Consult: Consult requested for bilat buttocks and sacrum.  Pt has red moist macerated skin and patchy areas of full thickness skin loss; appearance is consistent with a combination of moisture associated skin damage and shear.  2 areas of red moist Stage 2 pressure injuries to bilat buttocks, each is approx .5X.5X.1cm.  Pressure Injury POA: Yes Dressing procedure/placement/frequency: Topical treatment orders provided for bedside nurses to perform as follows: Foam dressing to sacrum and bilat buttocks.  Change Q 3 days or PRN when soiled.   Increase activity slowly   Complete by: As directed       Allergies as of 09/07/2024       Reactions   Transderm-scop [scopolamine] Other (See Comments)   Unknown reaction Documented on MAR        Medication List     STOP taking these medications    aspirin 81 MG chewable tablet   cephALEXin  500 MG capsule Commonly known as: KEFLEX    rivaroxaban 20 MG Tabs tablet Commonly known as: XARELTO       TAKE these medications    acetaminophen  325 MG tablet Commonly known as: TYLENOL  Take 650 mg by mouth every 6 (six) hours as needed (pain).   amLODipine  2.5 MG tablet Commonly known as: NORVASC  Take 2.5 mg by mouth daily.   dextromethorphan  30 MG/5ML liquid Commonly known as: DELSYM  Take 2.5 mLs (15 mg total) by mouth 2 (two) times daily.   hydrocortisone  25 MG suppository Commonly known as: ANUSOL -HC Place 1 suppository (25 mg total) rectally every 12 (twelve) hours.   losartan 50 MG tablet Commonly known as: COZAAR Take 50 mg by mouth every evening.   pantoprazole  40 MG tablet Commonly known as: PROTONIX  Take 1 tablet (40 mg total) by mouth 2 (two) times daily. What changed: when to take this   senna 8.6 MG Tabs tablet Commonly known as: SENOKOT Take 17.2 mg by mouth  daily.   sertraline  50 MG tablet Commonly known as: ZOLOFT  Take 1 tablet (50 mg total) by mouth at bedtime.   Vitamin D3 1000 units Caps Take 1,000 Units by mouth daily.               Discharge Care Instructions  (From admission, onward)           Start     Ordered   09/07/24 0000  Discharge wound care:       Comments: WOC Nurse Consult Note: Reason for Consult: Consult requested for bilat buttocks and sacrum.  Pt has red moist macerated skin and patchy areas of full thickness skin loss; appearance is consistent with a combination of moisture associated skin damage and shear.  2 areas of red moist Stage 2 pressure injuries to bilat buttocks, each is approx .5X.5X.1cm.  Pressure Injury POA: Yes Dressing procedure/placement/frequency: Topical treatment orders provided for bedside nurses to perform as follows: Foam dressing to sacrum and bilat buttocks.  Change Q 3 days or PRN when soiled.   09/07/24 1224  Allergies  Allergen Reactions   Transderm-Scop [Scopolamine] Other (See Comments)    Unknown reaction Documented on MAR      The results of significant diagnostics from this hospitalization (including imaging, microbiology, ancillary and laboratory) are listed below for reference.    Significant Diagnostic Studies: No results found.  Microbiology: No results found for this or any previous visit (from the past 240 hours).   Labs: Basic Metabolic Panel: Recent Labs  Lab 09/03/24 2001 09/03/24 2005 09/04/24 1623 09/05/24 0611 09/06/24 0315 09/07/24 0406  NA 141  --  140 142 142 142  K 4.1  --  3.9 3.9 3.9 3.7  CL 108  --  107 108 110 108  CO2 25  --  24 26 21* 24  GLUCOSE 115*  --  103* 90 87 90  BUN 22  --  22 19 13 14   CREATININE 1.11  --  1.00 0.90 0.92 1.08  CALCIUM 8.4*  --  8.2* 8.0* 7.9* 7.8*  MG  --  2.3  --   --   --   --    Liver Function Tests: Recent Labs  Lab 09/03/24 2001 09/04/24 1623  AST 15 14*  ALT 12 15  ALKPHOS  91 74  BILITOT 0.3 0.6  PROT 6.3* 6.9  ALBUMIN 2.9* 2.7*   No results for input(s): LIPASE, AMYLASE in the last 168 hours. No results for input(s): AMMONIA in the last 168 hours. CBC: Recent Labs  Lab 09/03/24 2005 09/04/24 1623 09/05/24 0611 09/06/24 0315 09/06/24 1238 09/07/24 0406  WBC 9.7 9.4 7.0 5.3  --  6.4  NEUTROABS 7.0 6.8  --  2.9  --  3.8  HGB 9.8* 10.7* 9.0* 8.2* 8.7* 8.3*  HCT 31.4* 34.3* 28.3* 25.5* 27.3* 25.0*  MCV 95.4 96.6 94.3 93.4  --  93.3  PLT 271 282 256 251  --  244   Cardiac Enzymes: No results for input(s): CKTOTAL, CKMB, CKMBINDEX, TROPONINI in the last 168 hours. BNP: BNP (last 3 results) No results for input(s): BNP in the last 8760 hours.  ProBNP (last 3 results) No results for input(s): PROBNP in the last 8760 hours.  CBG: No results for input(s): GLUCAP in the last 168 hours.  Signed:  Colen Grimes MD   [facility] 09/07/2024, 12:24 PM

## 2024-09-07 NOTE — NC FL2 (Signed)
  [place]  MEDICAID FL2 LEVEL OF CARE FORM     IDENTIFICATION  Patient Name: Alexander Bradley Birthdate: 02-07-1941 Sex: male Admission Date (Current Location): 09/04/2024  [facility] and [place] Number:  Producer, television/film/video and Address:  The . [facility], [place], 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   Atrial flutter (HCC) 06/23/2016   Essential hypertension 06/23/2016   Hyperlipidemia 06/23/2016    Orientation RESPIRATION BLADDER Height & Weight     Self, Place  Normal Incontinent Weight:   Height:     BEHAVIORAL SYMPTOMS/MOOD NEUROLOGICAL BOWEL NUTRITION STATUS      Incontinent Diet (See DC Summary)  AMBULATORY STATUS COMMUNICATION OF NEEDS Skin   Extensive Assist Verbally Other (Comment) (wound on buttock; wound on left arm)                       Personal Care Assistance Level of Assistance  Bathing, Feeding, Dressing Bathing Assistance: Maximum assistance Feeding assistance: Limited assistance Dressing Assistance: Maximum assistance     Functional Limitations Info  Speech, Hearing, Sight Sight Info: Impaired Hearing Info: Adequate Speech Info: Adequate    SPECIAL CARE FACTORS FREQUENCY                       Contractures Contractures Info: Not present     Additional Factors Info  Code Status, Allergies Code Status Info: DNR-Limited Allergies Info: Transderm-scop (Scopolamine)           Current Medications (09/07/2024):  This is the current hospital active medication list Current Facility-Administered Medications  Medication Dose Route Frequency Provider Last Rate Last Admin   amLODipine  (NORVASC ) tablet 2.5 mg  2.5 mg Oral Daily Samtani, Jai-Gurmukh, MD   2.5 mg at 09/07/24 1100   dextromethorphan  (DELSYM ) 30 MG/5ML liquid 15 mg  15 mg Oral BID Samtani, Jai-Gurmukh, MD   15 mg at 09/07/24 1100   pantoprazole  (PROTONIX ) EC tablet 40 mg  40 mg Oral Daily Claiborne, Claudia, MD   40 mg at 09/07/24 1100   sertraline  (ZOLOFT ) tablet 50 mg  50 mg Oral QHS Claiborne, Claudia, MD   50 mg at 09/06/24 2138     Discharge Medications: Please see discharge summary for a list of discharge medications.  Relevant Imaging Results:  Relevant Lab Results:   Additional Information SSN: [id number]  Jeoffrey LITTIE Moose, LCSWA

## 2024-09-07 NOTE — TOC Transition Note (Signed)
 Transition of Care [facility]) - Discharge Note   Patient Details  Name: Alexander Bradley MRN: [id number] Date of Birth: [birth year]  Transition of Care [facility]) CM/SW Contact:  Jeoffrey LITTIE Maranda ISRAEL Phone Number: 09/07/2024, 12:58 PM   Clinical Narrative:    Patient will DC to: [facility] Anticipated DC date: 09/07/24 Family notified: Yes Transport by: [facility]   Per MD patient ready for DC to [facility]. RN to call report prior to discharge [phone] room 109A. RN, patient, patient's family, and facility notified of DC. Discharge Summary and FL2 sent to facility. DC packet on chart. Ambulance transport requested for patient.   CSW will sign off for now as social work intervention is no longer needed. Please consult us  again if new needs arise.     Final next level of care: Long Term Nursing Home Barriers to Discharge: Barriers Resolved   Patient Goals and CMS Choice Patient states their goals for this hospitalization and ongoing recovery are:: return to [facility]          Discharge Placement   Existing PASRR number confirmed : 09/07/24          Patient chooses bed at: [facility] Patient to be transferred to facility by: [facility] Name of family member notified: [place] Patient and family notified of of transfer: 09/07/24  Discharge Plan and Services Additional resources added to the After Visit Summary for                                       Social Drivers of Health (SDOH) Interventions SDOH Screenings   Food Insecurity: Patient Unable To Answer (09/05/2024)  Housing: Low Risk  (07/05/2023)  Transportation Needs: No Transportation Needs (07/05/2023)  Utilities: Not At Risk (07/05/2023)  Tobacco Use: Low Risk  (09/04/2024)     Readmission Risk Interventions     No data to display

## 2024-09-21 DIAGNOSIS — I10 Essential (primary) hypertension: Secondary | ICD-10-CM | POA: Diagnosis not present

## 2024-09-24 DIAGNOSIS — M6281 Muscle weakness (generalized): Secondary | ICD-10-CM | POA: Diagnosis not present

## 2024-09-24 DIAGNOSIS — R1312 Dysphagia, oropharyngeal phase: Secondary | ICD-10-CM | POA: Diagnosis not present

## 2024-09-24 DIAGNOSIS — G301 Alzheimer's disease with late onset: Secondary | ICD-10-CM | POA: Diagnosis not present

## 2024-09-24 DIAGNOSIS — R278 Other lack of coordination: Secondary | ICD-10-CM | POA: Diagnosis not present

## 2024-09-24 DIAGNOSIS — G9340 Encephalopathy, unspecified: Secondary | ICD-10-CM | POA: Diagnosis not present

## 2024-09-25 DIAGNOSIS — G9340 Encephalopathy, unspecified: Secondary | ICD-10-CM | POA: Diagnosis not present

## 2024-09-25 DIAGNOSIS — R278 Other lack of coordination: Secondary | ICD-10-CM | POA: Diagnosis not present

## 2024-09-25 DIAGNOSIS — R1312 Dysphagia, oropharyngeal phase: Secondary | ICD-10-CM | POA: Diagnosis not present

## 2024-09-25 DIAGNOSIS — M6281 Muscle weakness (generalized): Secondary | ICD-10-CM | POA: Diagnosis not present

## 2024-10-07 DIAGNOSIS — I10 Essential (primary) hypertension: Secondary | ICD-10-CM | POA: Diagnosis not present

## 2024-10-07 DIAGNOSIS — F419 Anxiety disorder, unspecified: Secondary | ICD-10-CM | POA: Diagnosis not present

## 2024-10-07 DIAGNOSIS — K219 Gastro-esophageal reflux disease without esophagitis: Secondary | ICD-10-CM | POA: Diagnosis not present

## 2024-10-07 DIAGNOSIS — F329 Major depressive disorder, single episode, unspecified: Secondary | ICD-10-CM | POA: Diagnosis not present

## 2024-10-08 DIAGNOSIS — D649 Anemia, unspecified: Secondary | ICD-10-CM | POA: Diagnosis not present

## 2024-10-08 DIAGNOSIS — I10 Essential (primary) hypertension: Secondary | ICD-10-CM | POA: Diagnosis not present

## 2024-10-09 DIAGNOSIS — L602 Onychogryphosis: Secondary | ICD-10-CM | POA: Diagnosis not present

## 2024-10-09 DIAGNOSIS — I739 Peripheral vascular disease, unspecified: Secondary | ICD-10-CM | POA: Diagnosis not present

## 2024-10-22 DIAGNOSIS — G4700 Insomnia, unspecified: Secondary | ICD-10-CM | POA: Diagnosis not present

## 2024-10-22 DIAGNOSIS — G301 Alzheimer's disease with late onset: Secondary | ICD-10-CM | POA: Diagnosis not present

## 2024-11-27 DIAGNOSIS — F0390 Unspecified dementia without behavioral disturbance: Secondary | ICD-10-CM | POA: Diagnosis not present

## 2024-11-27 DIAGNOSIS — M6281 Muscle weakness (generalized): Secondary | ICD-10-CM | POA: Diagnosis not present

## 2024-11-27 DIAGNOSIS — F339 Major depressive disorder, recurrent, unspecified: Secondary | ICD-10-CM | POA: Diagnosis not present

## 2024-11-27 DIAGNOSIS — N179 Acute kidney failure, unspecified: Secondary | ICD-10-CM | POA: Diagnosis not present

## 2024-11-27 DIAGNOSIS — M6282 Rhabdomyolysis: Secondary | ICD-10-CM | POA: Diagnosis not present

## 2024-11-27 DIAGNOSIS — K219 Gastro-esophageal reflux disease without esophagitis: Secondary | ICD-10-CM | POA: Diagnosis not present
